# Patient Record
Sex: Male | Born: 1990
Health system: Southern US, Community
[De-identification: ages and names within clinical notes are randomized; demographics above are authoritative.]

## PROBLEM LIST (undated history)

## (undated) VITALS — BP 126/76 | HR 77 | Temp 97.8°F | Resp 16

## (undated) DIAGNOSIS — F39 Unspecified mood [affective] disorder: Secondary | ICD-10-CM

## (undated) DIAGNOSIS — F1911 Other psychoactive substance abuse, in remission: Secondary | ICD-10-CM

## (undated) DIAGNOSIS — R338 Other retention of urine: Secondary | ICD-10-CM

## (undated) DIAGNOSIS — F1010 Alcohol abuse, uncomplicated: Secondary | ICD-10-CM

## (undated) DIAGNOSIS — K645 Perianal venous thrombosis: Secondary | ICD-10-CM

## (undated) DIAGNOSIS — F329 Major depressive disorder, single episode, unspecified: Secondary | ICD-10-CM

## (undated) DIAGNOSIS — J302 Other seasonal allergic rhinitis: Secondary | ICD-10-CM

## (undated) DIAGNOSIS — R270 Ataxia, unspecified: Secondary | ICD-10-CM

## (undated) DIAGNOSIS — F32A Depression, unspecified: Secondary | ICD-10-CM

## (undated) DIAGNOSIS — K589 Irritable bowel syndrome without diarrhea: Secondary | ICD-10-CM

## (undated) DIAGNOSIS — F410 Panic disorder [episodic paroxysmal anxiety] without agoraphobia: Secondary | ICD-10-CM

## (undated) DIAGNOSIS — F1910 Other psychoactive substance abuse, uncomplicated: Secondary | ICD-10-CM

## (undated) HISTORY — DX: Unspecified mood (affective) disorder: F39

## (undated) HISTORY — DX: Other seasonal allergic rhinitis: J30.2

## (undated) HISTORY — PX: WISDOM TOOTH EXTRACTION: SHX21

## (undated) HISTORY — DX: Other psychoactive substance abuse, in remission: F19.11

## (undated) HISTORY — DX: Major depressive disorder, single episode, unspecified: F32.9

## (undated) HISTORY — DX: Other retention of urine: R33.8

## (undated) HISTORY — DX: Ataxia, unspecified: R27.0

## (undated) HISTORY — DX: Depression, unspecified: F32.A

## (undated) HISTORY — DX: Other psychoactive substance abuse, uncomplicated: F19.10

## (undated) HISTORY — DX: Alcohol abuse, uncomplicated: F10.10

## (undated) HISTORY — DX: Perianal venous thrombosis: K64.5

---
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
## 2011-01-24 ENCOUNTER — Emergency Department (HOSPITAL_COMMUNITY)
Admission: EM | Admit: 2011-01-24 | Discharge: 2011-01-24 | Disposition: A | Discharge status: Home / Self Care | Payer: 59 | Attending: Emergency Medicine | Admitting: Emergency Medicine

## 2011-01-24 DIAGNOSIS — T40904A Poisoning by unspecified psychodysleptics [hallucinogens], undetermined, initial encounter: Secondary | ICD-10-CM | POA: Insufficient documentation

## 2011-01-24 DIAGNOSIS — T40901A Poisoning by unspecified psychodysleptics [hallucinogens], accidental (unintentional), initial encounter: Secondary | ICD-10-CM | POA: Insufficient documentation

## 2011-01-24 DIAGNOSIS — E860 Dehydration: Secondary | ICD-10-CM | POA: Insufficient documentation

## 2011-01-24 DIAGNOSIS — I499 Cardiac arrhythmia, unspecified: Secondary | ICD-10-CM | POA: Insufficient documentation

## 2011-01-24 DIAGNOSIS — R112 Nausea with vomiting, unspecified: Secondary | ICD-10-CM | POA: Insufficient documentation

## 2011-01-24 LAB — POCT I-STAT, CHEM 8
BUN: 15 mg/dL (ref 6–23)
Calcium, Ion: 1.13 mmol/L (ref 1.12–1.32)
Hemoglobin: 13.3 g/dL (ref 13.0–17.0)
Sodium: 140 mEq/L (ref 135–145)
TCO2: 25 mmol/L (ref 0–100)

## 2011-06-04 ENCOUNTER — Ambulatory Visit: Payer: Self-pay | Admitting: Internal Medicine

## 2011-06-13 ENCOUNTER — Other Ambulatory Visit (INDEPENDENT_AMBULATORY_CARE_PROVIDER_SITE_OTHER): Payer: 59

## 2011-06-13 ENCOUNTER — Ambulatory Visit (INDEPENDENT_AMBULATORY_CARE_PROVIDER_SITE_OTHER): Payer: 59 | Admitting: Internal Medicine

## 2011-06-13 ENCOUNTER — Encounter: Payer: Self-pay | Admitting: Internal Medicine

## 2011-06-13 ENCOUNTER — Telehealth: Payer: Self-pay | Admitting: Internal Medicine

## 2011-06-13 VITALS — BP 140/80 | HR 80 | Temp 97.7°F | Resp 16 | Ht 73.0 in | Wt 162.0 lb

## 2011-06-13 DIAGNOSIS — R270 Ataxia, unspecified: Secondary | ICD-10-CM

## 2011-06-13 DIAGNOSIS — F1010 Alcohol abuse, uncomplicated: Secondary | ICD-10-CM

## 2011-06-13 DIAGNOSIS — R279 Unspecified lack of coordination: Secondary | ICD-10-CM

## 2011-06-13 DIAGNOSIS — F39 Unspecified mood [affective] disorder: Secondary | ICD-10-CM | POA: Insufficient documentation

## 2011-06-13 DIAGNOSIS — F1910 Other psychoactive substance abuse, uncomplicated: Secondary | ICD-10-CM

## 2011-06-13 LAB — COMPREHENSIVE METABOLIC PANEL
AST: 22 U/L (ref 0–37)
Alkaline Phosphatase: 85 U/L (ref 39–117)
BUN: 14 mg/dL (ref 6–23)
Glucose, Bld: 74 mg/dL (ref 70–99)
Sodium: 141 mEq/L (ref 135–145)
Total Bilirubin: 0.9 mg/dL (ref 0.3–1.2)
Total Protein: 7.3 g/dL (ref 6.0–8.3)

## 2011-06-13 LAB — CBC WITH DIFFERENTIAL/PLATELET
Eosinophils Relative: 3.5 % (ref 0.0–5.0)
HCT: 45.4 % (ref 39.0–52.0)
Hemoglobin: 15.5 g/dL (ref 13.0–17.0)
Lymphocytes Relative: 34.3 % (ref 12.0–46.0)
Lymphs Abs: 1.8 10*3/uL (ref 0.7–4.0)
Monocytes Relative: 10.1 % (ref 3.0–12.0)
Platelets: 358 10*3/uL (ref 150.0–400.0)
WBC: 5.2 10*3/uL (ref 4.5–10.5)

## 2011-06-13 LAB — URINALYSIS
Nitrite: NEGATIVE
Total Protein, Urine: NEGATIVE
Urine Glucose: NEGATIVE
pH: 6 (ref 5.0–8.0)

## 2011-06-13 LAB — TSH: TSH: 1.62 u[IU]/mL (ref 0.35–5.50)

## 2011-06-13 LAB — VITAMIN B12: Vitamin B-12: 565 pg/mL (ref 211–911)

## 2011-06-13 MED ORDER — VITAMIN D 1000 UNITS PO TABS: 1000.0000 [IU] | ORAL_TABLET | Freq: Every day | ORAL | Status: DC

## 2011-06-13 NOTE — Telephone Encounter (Signed)
Pt's father informed.

## 2011-06-13 NOTE — Progress Notes (Signed)
  Subjective:    Patient ID: Jay Stevenson, male    DOB: June 15, 1991, 20 y.o.   MRN: [id number]  HPI  New pt with a h/o ADD  Behavior problems x years: drug and alcohol use, severe intoxication in the past; hospitalization this past spring w/delirium. No drug or alcohol use x 1 mo per pt. Now he is  in [employer] - taking basic courses   Review of Systems  Constitutional: Negative for appetite change, fatigue and unexpected weight change.  HENT: Negative for nosebleeds, congestion, sore throat, sneezing, trouble swallowing and neck pain.   Eyes: Negative for itching and visual disturbance.  Respiratory: Negative for cough.   Cardiovascular: Negative for chest pain, palpitations and leg swelling.  Gastrointestinal: Negative for nausea, diarrhea, blood in stool and abdominal distention.  Genitourinary: Negative for frequency and hematuria.  Musculoskeletal: Negative for back pain, joint swelling and gait problem.  Skin: Negative for rash.  Neurological: Negative for dizziness, tremors, speech difficulty and weakness.  Psychiatric/Behavioral: Negative for suicidal ideas, hallucinations, confusion, sleep disturbance, dysphoric mood and agitation. Behavioral problem: anger issues - less now; not homicidal. The patient is not nervous/anxious and is not hyperactive.        Objective:   Physical Exam  Constitutional: He is oriented to person, place, and time. He appears well-developed.  HENT:  Mouth/Throat: Oropharynx is clear and moist.  Eyes: Conjunctivae are normal. Pupils are equal, round, and reactive to light.  Neck: Normal range of motion. No JVD present. No thyromegaly present.  Cardiovascular: Normal rate, regular rhythm, normal heart sounds and intact distal pulses.  Exam reveals no gallop and no friction rub.   No murmur heard. Pulmonary/Chest: Effort normal and breath sounds normal. No respiratory distress. He has no wheezes. He has no rales. He exhibits no  tenderness.  Abdominal: Soft. Bowel sounds are normal. He exhibits no distension and no mass. There is no tenderness. There is no rebound and no guarding.  Musculoskeletal: Normal range of motion. He exhibits no edema and no tenderness.  Lymphadenopathy:    He has no cervical adenopathy.  Neurological: He is alert and oriented to person, place, and time. He has normal reflexes. No cranial nerve deficit. He exhibits normal muscle tone. Coordination (ataxic: strait line walking is unsteady) abnormal.  Skin: Skin is warm and dry. No rash noted.  Psychiatric: He has a normal mood and affect. His behavior is normal. Thought content normal.       A/o/c          Assessment & Plan:

## 2011-06-13 NOTE — Telephone Encounter (Signed)
Stacey, please, inform patient that all labs are normal Thx 

## 2011-06-16 ENCOUNTER — Encounter: Payer: Self-pay | Admitting: Internal Medicine

## 2011-06-16 DIAGNOSIS — F1011 Alcohol abuse, in remission: Secondary | ICD-10-CM | POA: Insufficient documentation

## 2011-06-16 DIAGNOSIS — F1911 Other psychoactive substance abuse, in remission: Secondary | ICD-10-CM | POA: Insufficient documentation

## 2011-06-16 DIAGNOSIS — R270 Ataxia, unspecified: Secondary | ICD-10-CM

## 2011-06-16 HISTORY — DX: Ataxia, unspecified: R27.0

## 2011-06-16 NOTE — Assessment & Plan Note (Signed)
Better now per pt

## 2011-06-16 NOTE — Assessment & Plan Note (Signed)
Induced by prior drug use, I guess... Discussed Get labs He had a brain scan 1-2 years back

## 2011-06-16 NOTE — Assessment & Plan Note (Signed)
Teenage years. Discussed. He quit.  Potential risks  and complications of drugs and alcohol use including death were explained to the patient and were aknowledged. He will see Dr Dellia Cloud

## 2011-06-16 NOTE — Assessment & Plan Note (Signed)
Teenage years Discussed. No alcohol x 30 d

## 2011-06-24 ENCOUNTER — Ambulatory Visit (INDEPENDENT_AMBULATORY_CARE_PROVIDER_SITE_OTHER): Payer: 59 | Admitting: Licensed Clinical Social Worker

## 2011-06-24 DIAGNOSIS — F331 Major depressive disorder, recurrent, moderate: Secondary | ICD-10-CM

## 2011-06-24 DIAGNOSIS — F411 Generalized anxiety disorder: Secondary | ICD-10-CM

## 2011-07-01 ENCOUNTER — Ambulatory Visit (INDEPENDENT_AMBULATORY_CARE_PROVIDER_SITE_OTHER): Payer: 59 | Admitting: Licensed Clinical Social Worker

## 2011-07-01 DIAGNOSIS — F331 Major depressive disorder, recurrent, moderate: Secondary | ICD-10-CM

## 2011-07-01 DIAGNOSIS — F411 Generalized anxiety disorder: Secondary | ICD-10-CM

## 2011-07-08 ENCOUNTER — Ambulatory Visit (INDEPENDENT_AMBULATORY_CARE_PROVIDER_SITE_OTHER): Payer: 59 | Admitting: Licensed Clinical Social Worker

## 2011-07-08 DIAGNOSIS — F411 Generalized anxiety disorder: Secondary | ICD-10-CM

## 2011-07-08 DIAGNOSIS — F331 Major depressive disorder, recurrent, moderate: Secondary | ICD-10-CM

## 2011-07-22 ENCOUNTER — Ambulatory Visit (INDEPENDENT_AMBULATORY_CARE_PROVIDER_SITE_OTHER): Payer: 59 | Admitting: Licensed Clinical Social Worker

## 2011-07-22 DIAGNOSIS — F331 Major depressive disorder, recurrent, moderate: Secondary | ICD-10-CM

## 2011-07-22 DIAGNOSIS — F411 Generalized anxiety disorder: Secondary | ICD-10-CM

## 2011-07-29 ENCOUNTER — Ambulatory Visit: Payer: 59 | Admitting: Licensed Clinical Social Worker

## 2011-07-31 ENCOUNTER — Ambulatory Visit (INDEPENDENT_AMBULATORY_CARE_PROVIDER_SITE_OTHER): Payer: 59 | Admitting: Licensed Clinical Social Worker

## 2011-07-31 DIAGNOSIS — F331 Major depressive disorder, recurrent, moderate: Secondary | ICD-10-CM

## 2011-07-31 DIAGNOSIS — F411 Generalized anxiety disorder: Secondary | ICD-10-CM

## 2011-08-05 ENCOUNTER — Ambulatory Visit (INDEPENDENT_AMBULATORY_CARE_PROVIDER_SITE_OTHER): Payer: 59 | Admitting: Licensed Clinical Social Worker

## 2011-08-05 DIAGNOSIS — F411 Generalized anxiety disorder: Secondary | ICD-10-CM

## 2011-08-05 DIAGNOSIS — F331 Major depressive disorder, recurrent, moderate: Secondary | ICD-10-CM

## 2011-08-12 ENCOUNTER — Ambulatory Visit (INDEPENDENT_AMBULATORY_CARE_PROVIDER_SITE_OTHER): Payer: 59 | Admitting: Licensed Clinical Social Worker

## 2011-08-12 DIAGNOSIS — F411 Generalized anxiety disorder: Secondary | ICD-10-CM

## 2011-08-12 DIAGNOSIS — F331 Major depressive disorder, recurrent, moderate: Secondary | ICD-10-CM

## 2011-08-19 ENCOUNTER — Ambulatory Visit: Payer: Self-pay | Admitting: Licensed Clinical Social Worker

## 2011-08-20 ENCOUNTER — Encounter: Payer: Self-pay | Admitting: Internal Medicine

## 2011-08-20 ENCOUNTER — Ambulatory Visit (INDEPENDENT_AMBULATORY_CARE_PROVIDER_SITE_OTHER): Payer: 59 | Admitting: Internal Medicine

## 2011-08-20 VITALS — BP 132/90 | HR 72 | Temp 97.9°F | Resp 16 | Wt 171.0 lb

## 2011-08-20 DIAGNOSIS — R279 Unspecified lack of coordination: Secondary | ICD-10-CM

## 2011-08-20 DIAGNOSIS — F39 Unspecified mood [affective] disorder: Secondary | ICD-10-CM

## 2011-08-20 DIAGNOSIS — Z23 Encounter for immunization: Secondary | ICD-10-CM

## 2011-08-20 DIAGNOSIS — F1910 Other psychoactive substance abuse, uncomplicated: Secondary | ICD-10-CM

## 2011-08-20 DIAGNOSIS — R270 Ataxia, unspecified: Secondary | ICD-10-CM

## 2011-08-21 NOTE — Assessment & Plan Note (Signed)
No relapse 

## 2011-08-21 NOTE — Progress Notes (Signed)
  Subjective:    Patient ID: Jay Stevenson, male    DOB: 11/16/1990, 20 y.o.   MRN: [id number]  HPI    The patient is here to follow up on chronic mood disorder, depression, anxiety symptoms controlled with his determination to get better and exercise. He states and his dad is confirming that Jay Stevenson is doing much better   Review of Systems  Constitutional: Negative for appetite change, fatigue and unexpected weight change.  HENT: Negative for nosebleeds, congestion, sore throat, sneezing, trouble swallowing and neck pain.   Eyes: Negative for itching and visual disturbance.  Respiratory: Negative for cough.   Cardiovascular: Negative for chest pain, palpitations and leg swelling.  Gastrointestinal: Negative for nausea, diarrhea, blood in stool and abdominal distention.  Genitourinary: Negative for frequency and hematuria.  Musculoskeletal: Negative for back pain, joint swelling and gait problem.  Skin: Negative for rash.  Neurological: Negative for dizziness, tremors, speech difficulty and weakness.  Psychiatric/Behavioral: Positive for behavioral problems (better, much less angry) and decreased concentration (better). Negative for suicidal ideas, hallucinations, confusion, sleep disturbance, self-injury, dysphoric mood and agitation. The patient is not nervous/anxious and is not hyperactive.        Objective:   Physical Exam  Constitutional: He appears well-developed and well-nourished. No distress.  Cardiovascular: Normal rate.  Exam reveals no gallop.   Pulmonary/Chest: He has no wheezes. He has no rales.  Abdominal: There is no tenderness.  Psychiatric: He has a normal mood and affect. His behavior is normal. Judgment and thought content normal.          Assessment & Plan:

## 2011-08-21 NOTE — Assessment & Plan Note (Addendum)
Better. Discussed. He has been seeing S Bond weeekly

## 2011-08-21 NOTE — Assessment & Plan Note (Signed)
Better  

## 2011-08-26 ENCOUNTER — Telehealth: Payer: Self-pay | Admitting: *Deleted

## 2011-08-26 ENCOUNTER — Ambulatory Visit (INDEPENDENT_AMBULATORY_CARE_PROVIDER_SITE_OTHER): Payer: 59 | Admitting: Licensed Clinical Social Worker

## 2011-08-26 DIAGNOSIS — F331 Major depressive disorder, recurrent, moderate: Secondary | ICD-10-CM

## 2011-08-26 DIAGNOSIS — F411 Generalized anxiety disorder: Secondary | ICD-10-CM

## 2011-08-26 MED ORDER — BUPROPION HCL ER (XL) 150 MG PO TB24: 150.0000 mg | ORAL_TABLET | ORAL | Status: DC

## 2011-08-26 NOTE — Telephone Encounter (Signed)
Wellbutrin discussed at 11.21.12 OV; patient would like to get Rx.

## 2011-08-26 NOTE — Telephone Encounter (Signed)
Ok Wellbutr xl 1 po qam. ROV 6-8 wks. Let me or S Bond know immidiately if any suicidal thoughts appear Thx

## 2011-08-27 NOTE — Telephone Encounter (Signed)
Pt advised of Rx/pharmacy via VM and asked to call back (or dr Lottie Dawson) with any side effects.

## 2011-09-02 ENCOUNTER — Ambulatory Visit (INDEPENDENT_AMBULATORY_CARE_PROVIDER_SITE_OTHER): Payer: 59 | Admitting: Licensed Clinical Social Worker

## 2011-09-02 DIAGNOSIS — F331 Major depressive disorder, recurrent, moderate: Secondary | ICD-10-CM

## 2011-09-02 DIAGNOSIS — F411 Generalized anxiety disorder: Secondary | ICD-10-CM

## 2011-09-09 ENCOUNTER — Ambulatory Visit (INDEPENDENT_AMBULATORY_CARE_PROVIDER_SITE_OTHER): Payer: 59 | Admitting: Licensed Clinical Social Worker

## 2011-09-09 DIAGNOSIS — F331 Major depressive disorder, recurrent, moderate: Secondary | ICD-10-CM

## 2011-09-09 DIAGNOSIS — F411 Generalized anxiety disorder: Secondary | ICD-10-CM

## 2011-09-16 ENCOUNTER — Ambulatory Visit (INDEPENDENT_AMBULATORY_CARE_PROVIDER_SITE_OTHER): Payer: 59 | Admitting: Licensed Clinical Social Worker

## 2011-09-16 DIAGNOSIS — F411 Generalized anxiety disorder: Secondary | ICD-10-CM

## 2011-09-16 DIAGNOSIS — F331 Major depressive disorder, recurrent, moderate: Secondary | ICD-10-CM

## 2011-10-06 ENCOUNTER — Telehealth: Payer: Self-pay | Admitting: *Deleted

## 2011-10-06 NOTE — Telephone Encounter (Signed)
Pt's father left vm stating pt is having urinary symptoms. He stated they were in [place] and wanted advisement. I called the father as soon as I checked message and he states he went ahead and took pt to an UC near by and he was dx and treated for UTI.

## 2011-10-07 ENCOUNTER — Ambulatory Visit (INDEPENDENT_AMBULATORY_CARE_PROVIDER_SITE_OTHER): Payer: 59 | Admitting: Licensed Clinical Social Worker

## 2011-10-07 DIAGNOSIS — F411 Generalized anxiety disorder: Secondary | ICD-10-CM

## 2011-10-07 DIAGNOSIS — F331 Major depressive disorder, recurrent, moderate: Secondary | ICD-10-CM

## 2011-10-15 ENCOUNTER — Ambulatory Visit (INDEPENDENT_AMBULATORY_CARE_PROVIDER_SITE_OTHER): Payer: 59 | Admitting: Licensed Clinical Social Worker

## 2011-10-15 DIAGNOSIS — F331 Major depressive disorder, recurrent, moderate: Secondary | ICD-10-CM

## 2011-10-15 DIAGNOSIS — F411 Generalized anxiety disorder: Secondary | ICD-10-CM

## 2011-10-22 ENCOUNTER — Ambulatory Visit (INDEPENDENT_AMBULATORY_CARE_PROVIDER_SITE_OTHER): Payer: 59 | Admitting: Licensed Clinical Social Worker

## 2011-10-22 DIAGNOSIS — F331 Major depressive disorder, recurrent, moderate: Secondary | ICD-10-CM

## 2011-10-22 DIAGNOSIS — F411 Generalized anxiety disorder: Secondary | ICD-10-CM

## 2011-10-29 ENCOUNTER — Ambulatory Visit (INDEPENDENT_AMBULATORY_CARE_PROVIDER_SITE_OTHER): Payer: 59 | Admitting: Licensed Clinical Social Worker

## 2011-10-29 DIAGNOSIS — F411 Generalized anxiety disorder: Secondary | ICD-10-CM

## 2011-10-29 DIAGNOSIS — F331 Major depressive disorder, recurrent, moderate: Secondary | ICD-10-CM

## 2011-11-05 ENCOUNTER — Ambulatory Visit (INDEPENDENT_AMBULATORY_CARE_PROVIDER_SITE_OTHER): Payer: 59 | Admitting: Licensed Clinical Social Worker

## 2011-11-05 DIAGNOSIS — F411 Generalized anxiety disorder: Secondary | ICD-10-CM

## 2011-11-05 DIAGNOSIS — F331 Major depressive disorder, recurrent, moderate: Secondary | ICD-10-CM

## 2011-11-18 ENCOUNTER — Ambulatory Visit: Payer: 59 | Admitting: Licensed Clinical Social Worker

## 2011-11-26 ENCOUNTER — Ambulatory Visit (INDEPENDENT_AMBULATORY_CARE_PROVIDER_SITE_OTHER): Payer: 59 | Admitting: Licensed Clinical Social Worker

## 2011-11-26 DIAGNOSIS — F411 Generalized anxiety disorder: Secondary | ICD-10-CM

## 2011-11-26 DIAGNOSIS — F331 Major depressive disorder, recurrent, moderate: Secondary | ICD-10-CM

## 2011-12-03 ENCOUNTER — Ambulatory Visit (INDEPENDENT_AMBULATORY_CARE_PROVIDER_SITE_OTHER): Payer: 59 | Admitting: Licensed Clinical Social Worker

## 2011-12-03 DIAGNOSIS — F331 Major depressive disorder, recurrent, moderate: Secondary | ICD-10-CM

## 2011-12-03 DIAGNOSIS — F411 Generalized anxiety disorder: Secondary | ICD-10-CM

## 2011-12-12 ENCOUNTER — Telehealth: Payer: Self-pay | Admitting: *Deleted

## 2011-12-12 NOTE — Telephone Encounter (Signed)
Pt's father calling req referral to [facility] for neuroimaging that was recommended by his psychiatrist to check for possible head injury. Please advise.  [facility] number is [phone].

## 2011-12-14 NOTE — Telephone Encounter (Signed)
I'm not sure what this is - best to come for an OV Thx

## 2011-12-15 NOTE — Telephone Encounter (Signed)
Left message for pts father to call back. 

## 2011-12-15 NOTE — Telephone Encounter (Signed)
Pt's father informed. OV scheduled.

## 2011-12-16 ENCOUNTER — Ambulatory Visit: Payer: 59 | Admitting: Licensed Clinical Social Worker

## 2011-12-17 ENCOUNTER — Encounter: Payer: Self-pay | Admitting: Internal Medicine

## 2011-12-17 ENCOUNTER — Ambulatory Visit (INDEPENDENT_AMBULATORY_CARE_PROVIDER_SITE_OTHER): Payer: 59 | Admitting: Licensed Clinical Social Worker

## 2011-12-17 ENCOUNTER — Ambulatory Visit (INDEPENDENT_AMBULATORY_CARE_PROVIDER_SITE_OTHER): Payer: 59 | Admitting: Internal Medicine

## 2011-12-17 VITALS — BP 140/90 | HR 88 | Temp 96.5°F | Resp 16 | Wt 176.0 lb

## 2011-12-17 DIAGNOSIS — R270 Ataxia, unspecified: Secondary | ICD-10-CM

## 2011-12-17 DIAGNOSIS — F1010 Alcohol abuse, uncomplicated: Secondary | ICD-10-CM

## 2011-12-17 DIAGNOSIS — R279 Unspecified lack of coordination: Secondary | ICD-10-CM

## 2011-12-17 DIAGNOSIS — F411 Generalized anxiety disorder: Secondary | ICD-10-CM

## 2011-12-17 DIAGNOSIS — F1910 Other psychoactive substance abuse, uncomplicated: Secondary | ICD-10-CM

## 2011-12-17 DIAGNOSIS — F39 Unspecified mood [affective] disorder: Secondary | ICD-10-CM

## 2011-12-17 DIAGNOSIS — F331 Major depressive disorder, recurrent, moderate: Secondary | ICD-10-CM

## 2011-12-17 MED ORDER — L-METHYLFOLATE 15 MG PO TABS: ORAL_TABLET | ORAL | Status: DC

## 2011-12-17 NOTE — Assessment & Plan Note (Signed)
Denies using now

## 2011-12-17 NOTE — Assessment & Plan Note (Signed)
Better  

## 2011-12-17 NOTE — Assessment & Plan Note (Signed)
Not drinking 

## 2011-12-17 NOTE — Assessment & Plan Note (Signed)
Psych consult Samples of Deplin 15 mg a day

## 2011-12-17 NOTE — Progress Notes (Signed)
Patient ID: Clovis Mankins, male   DOB: 02/13/1991, 21 y.o.   MRN: [id number]  Subjective:    Patient ID: Sallie Maker, male    DOB: Mar 07, 1991, 21 y.o.   MRN: [id number]  HPI    The patient is here to follow up on chronic mood disorder, depression, anxiety symptoms controlled with his determination to get better and exercise. He states and his dad is confirming that Senay is doing much better  BP Readings from Last 3 Encounters:  12/17/11 140/90  08/20/11 132/90  06/13/11 140/80   Wt Readings from Last 3 Encounters:  12/17/11 176 lb (79.833 kg) (77.88%*)  08/20/11 171 lb (77.565 kg) (74.14%*)  06/13/11 162 lb (73.483 kg) (64.14%*)   * Growth percentiles are based on CDC 2-20 Years data.    Review of Systems  Constitutional: Negative for appetite change, fatigue and unexpected weight change.  HENT: Negative for nosebleeds, congestion, sore throat, sneezing, trouble swallowing and neck pain.   Eyes: Negative for itching and visual disturbance.  Respiratory: Negative for cough.   Cardiovascular: Negative for chest pain, palpitations and leg swelling.  Gastrointestinal: Negative for nausea, diarrhea, blood in stool and abdominal distention.  Genitourinary: Negative for frequency and hematuria.  Musculoskeletal: Negative for back pain, joint swelling and gait problem.  Skin: Negative for rash.  Neurological: Negative for dizziness, tremors, speech difficulty and weakness.  Psychiatric/Behavioral: Positive for behavioral problems (better, much less angry) and decreased concentration (better). Negative for suicidal ideas, hallucinations, confusion, sleep disturbance, self-injury, dysphoric mood and agitation. The patient is not nervous/anxious and is not hyperactive.        Objective:   Physical Exam  Constitutional: He appears well-developed and well-nourished. No distress.  Cardiovascular: Normal rate.  Exam reveals no gallop.   Pulmonary/Chest: He has no wheezes. He has no  rales.  Abdominal: There is no tenderness.  Psychiatric: He has a normal mood and affect. His behavior is normal. Judgment and thought content normal.          Assessment & Plan:

## 2011-12-24 ENCOUNTER — Ambulatory Visit (INDEPENDENT_AMBULATORY_CARE_PROVIDER_SITE_OTHER): Payer: 59 | Admitting: Licensed Clinical Social Worker

## 2011-12-24 DIAGNOSIS — F331 Major depressive disorder, recurrent, moderate: Secondary | ICD-10-CM

## 2011-12-24 DIAGNOSIS — F411 Generalized anxiety disorder: Secondary | ICD-10-CM

## 2012-01-01 ENCOUNTER — Ambulatory Visit (INDEPENDENT_AMBULATORY_CARE_PROVIDER_SITE_OTHER): Payer: 59 | Admitting: Licensed Clinical Social Worker

## 2012-01-01 DIAGNOSIS — F411 Generalized anxiety disorder: Secondary | ICD-10-CM

## 2012-01-01 DIAGNOSIS — F331 Major depressive disorder, recurrent, moderate: Secondary | ICD-10-CM

## 2012-01-14 ENCOUNTER — Ambulatory Visit (INDEPENDENT_AMBULATORY_CARE_PROVIDER_SITE_OTHER): Payer: 59 | Admitting: Licensed Clinical Social Worker

## 2012-01-14 DIAGNOSIS — F411 Generalized anxiety disorder: Secondary | ICD-10-CM

## 2012-01-14 DIAGNOSIS — F331 Major depressive disorder, recurrent, moderate: Secondary | ICD-10-CM

## 2012-01-28 ENCOUNTER — Ambulatory Visit (INDEPENDENT_AMBULATORY_CARE_PROVIDER_SITE_OTHER): Payer: 59 | Admitting: Licensed Clinical Social Worker

## 2012-01-28 DIAGNOSIS — F331 Major depressive disorder, recurrent, moderate: Secondary | ICD-10-CM

## 2012-01-28 DIAGNOSIS — F411 Generalized anxiety disorder: Secondary | ICD-10-CM

## 2012-03-21 ENCOUNTER — Emergency Department (HOSPITAL_COMMUNITY)
Admission: EM | Admit: 2012-03-21 | Discharge: 2012-03-22 | Disposition: A | Discharge status: Home / Self Care | Payer: 59 | Attending: Emergency Medicine | Admitting: Emergency Medicine

## 2012-03-21 ENCOUNTER — Encounter (HOSPITAL_COMMUNITY): Payer: Self-pay | Admitting: Emergency Medicine

## 2012-03-21 DIAGNOSIS — K644 Residual hemorrhoidal skin tags: Secondary | ICD-10-CM | POA: Insufficient documentation

## 2012-03-21 DIAGNOSIS — K649 Unspecified hemorrhoids: Secondary | ICD-10-CM

## 2012-03-21 DIAGNOSIS — Z87891 Personal history of nicotine dependence: Secondary | ICD-10-CM | POA: Insufficient documentation

## 2012-03-21 MED ORDER — LIDOCAINE HCL 1 % IJ SOLN
INTRAMUSCULAR | Status: AC
  Filled 2012-03-21: qty 20

## 2012-03-21 MED ORDER — LIDOCAINE HCL 2 % IJ SOLN
5.0000 mL | Freq: Once | INTRAMUSCULAR | Status: DC
  Filled 2012-03-21: qty 1

## 2012-03-21 NOTE — ED Notes (Signed)
Pt alert, nad, hx hemorrhoids, c/o rectal pain, last BM was yesterday

## 2012-03-21 NOTE — ED Notes (Signed)
Pt states that he is having new onset rectal pain that began this morning. Pt states that he thought it was hemorrhoids. Pt states that he has not had any bleeding. Pt denies any trauma or penetration.

## 2012-03-21 NOTE — ED Provider Notes (Signed)
History     CSN: [id number]  Arrival date & time 03/21/12  2148   First MD Initiated Contact with Patient 03/21/12 2231      Chief Complaint  Patient presents with  . Rectal Pain    HPI  History provided by the patient. Patient is a 21 year old male with no significant past medical history who presents with complaints of "hemorrhoids" and rectal pain. Patient states that symptoms began earlier this morning after sitting for prolonged time on the toilet. Patient denies having any diarrhea or constipation symptoms. He does report straining slightly with bowel movement. Following that time he has had increasing pain to the rectal area. Patient states she looked in the mirror and saw a swollen area is that he thought were hemorrhoids. Patient denies having similar symptoms previously. Patient went to the store and has been trying to use over-the-counter hemorrhoid creams and ointments without improvement. Patient is also soak in warm tub but complains of increasing pain. Patient states he is uncomfortable in any position. Patient has not attempted any other treatments. He denies any other aggravating or alleviating factors. Patient denies any other associated symptoms. Denies any bleeding or drainage.    Past Medical History  Diagnosis Date  . Mood disorder     anger management  . Polysubstance abuse   . Alcohol abuse   . H/O: substance abuse     History reviewed. No pertinent past surgical history.  Family History  Problem Relation Age of Onset  . Depression Mother     History  Substance Use Topics  . 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 Smokeless tobacco: Not on file  . Alcohol Use: No     quit in Aug 2012      Review of Systems  Constitutional: Negative for fever.  Gastrointestinal: Positive for rectal pain. Negative for nausea, vomiting, abdominal pain, diarrhea, constipation and anal bleeding.    Allergies  Review of patient's allergies indicates no known  allergies.  Home Medications  No current outpatient prescriptions on file.  BP 132/75  Pulse 100  Temp 97.9 F (36.6 C) (Oral)  Resp 18  SpO2 99%  Physical Exam  Nursing note and vitals reviewed. Constitutional: He is oriented to person, place, and time. He appears well-developed and well-nourished. No distress.  HENT:  Head: Normocephalic.  Cardiovascular: Normal rate and regular rhythm.   Pulmonary/Chest: Effort normal and breath sounds normal.  Abdominal: Soft. There is no tenderness.  Genitourinary: Rectal exam shows external hemorrhoid and tenderness.       Several swollen and tender hemorrhoids.  No bleeding.  Neurological: He is alert and oriented to person, place, and time.  Skin: Skin is warm.  Psychiatric: He has a normal mood and affect.    ED Course  Procedures   INCISION AND DRAINAGE Performed by: Angus Seller Consent: Verbal consent obtained. Risks and benefits: risks, benefits and alternatives were discussed Type: hemorrhoid  Body area: anus  Anesthesia: local infiltration  Local anesthetic: lidocaine 2% without epinephrine  Anesthetic total: 1 ml  Complexity: complex Blunt dissection to remove clots   Patient tolerance: Patient tolerated the procedure well with no immediate complications, bleeding controlled, pain improved.      1. Hemorrhoids       MDM  Patient seen and evaluated. Patient in no acute distress        Angus Seller, [place] 03/22/12 [phone]

## 2012-03-22 ENCOUNTER — Encounter (HOSPITAL_COMMUNITY): Payer: Self-pay | Admitting: Emergency Medicine

## 2012-03-22 ENCOUNTER — Telehealth: Payer: Self-pay | Admitting: Internal Medicine

## 2012-03-22 ENCOUNTER — Emergency Department (HOSPITAL_COMMUNITY)
Admission: EM | Admit: 2012-03-22 | Discharge: 2012-03-23 | Disposition: A | Discharge status: Home / Self Care | Payer: 59 | Attending: Emergency Medicine | Admitting: Emergency Medicine

## 2012-03-22 ENCOUNTER — Telehealth (INDEPENDENT_AMBULATORY_CARE_PROVIDER_SITE_OTHER): Payer: Self-pay | Admitting: General Surgery

## 2012-03-22 DIAGNOSIS — Z818 Family history of other mental and behavioral disorders: Secondary | ICD-10-CM | POA: Insufficient documentation

## 2012-03-22 DIAGNOSIS — Z809 Family history of malignant neoplasm, unspecified: Secondary | ICD-10-CM | POA: Insufficient documentation

## 2012-03-22 DIAGNOSIS — Z8249 Family history of ischemic heart disease and other diseases of the circulatory system: Secondary | ICD-10-CM | POA: Insufficient documentation

## 2012-03-22 DIAGNOSIS — R339 Retention of urine, unspecified: Secondary | ICD-10-CM | POA: Insufficient documentation

## 2012-03-22 DIAGNOSIS — F1010 Alcohol abuse, uncomplicated: Secondary | ICD-10-CM | POA: Insufficient documentation

## 2012-03-22 DIAGNOSIS — F411 Generalized anxiety disorder: Secondary | ICD-10-CM | POA: Insufficient documentation

## 2012-03-22 DIAGNOSIS — F1910 Other psychoactive substance abuse, uncomplicated: Secondary | ICD-10-CM | POA: Insufficient documentation

## 2012-03-22 DIAGNOSIS — Z87891 Personal history of nicotine dependence: Secondary | ICD-10-CM | POA: Insufficient documentation

## 2012-03-22 DIAGNOSIS — Z823 Family history of stroke: Secondary | ICD-10-CM | POA: Insufficient documentation

## 2012-03-22 DIAGNOSIS — F39 Unspecified mood [affective] disorder: Secondary | ICD-10-CM | POA: Insufficient documentation

## 2012-03-22 HISTORY — DX: Panic disorder (episodic paroxysmal anxiety): F41.0

## 2012-03-22 MED ORDER — POLYETHYLENE GLYCOL 3350 17 GM/SCOOP PO POWD: 17.0000 g | Freq: Every day | ORAL | Status: AC

## 2012-03-22 MED ORDER — OXYCODONE-ACETAMINOPHEN 5-325 MG PO TABS
2.0000 | ORAL_TABLET | Freq: Once | ORAL | Status: AC
  Administered 2012-03-22: 2 via ORAL
  Filled 2012-03-22: qty 2

## 2012-03-22 MED ORDER — OXYCODONE-ACETAMINOPHEN 10-325 MG PO TABS: 1.0000 | ORAL_TABLET | ORAL | Status: AC | PRN

## 2012-03-22 MED ORDER — SILODOSIN 4 MG PO CAPS: 4.0000 mg | ORAL_CAPSULE | Freq: Every day | ORAL | Status: DC

## 2012-03-22 NOTE — Telephone Encounter (Signed)
Informed pt's mother to start Rapaflo 4 mg and hold oxycodone. Take Ibuprofen 800 mg po bid prn and let us know if symptoms persist.

## 2012-03-22 NOTE — ED Notes (Signed)
Pt. Complaint is pain is getting worse and is becoming unbearable

## 2012-03-22 NOTE — Telephone Encounter (Signed)
Pt's mother Darl Pikes [phone]) is stating that pt has not been able to urinate since having procedure done at ER last night. Pt denies urinary pain but thinks that urinary difficulty is related to extreme anxiety related to "excruciating pain" from hemorrhoids and surgery. Please advise

## 2012-03-22 NOTE — Telephone Encounter (Signed)
Caller: Susan/Mother; PCP: Sonda Primes; CB#: [phone]; ; ; Call regarding Urinary Pain/Bleeding;  Mom is calling to say the pt had to have several painful bleeding hemhorroids removed at the ED/ [facility] at midnight last night. Pt has anxiety/severe. Mom has taken him to a psychiatrist and is treating him with Xanax 0.5mg  - 1mg  po BID but under the circumstances he is very anxious. Today the pt has urinated x 1 this am at 06:30 but has been complaining that it is difficult. Pt has fallen asleep now but is insistent that if he has trouble urinating he wants to go to the hospital and be catheterized. Mom is calling the office to ask if catheterization at the office is a possibility if when he wakes up and it is challenging. OFFICE PLEASE CALL MOM BACK AND LET HER KNOW.

## 2012-03-22 NOTE — Telephone Encounter (Signed)
Mother of pt calling with question about hems:  Son in ER last night where his thrombosed hem was opened and clots evacuated.  This morning he is still actively bleeding.  Mother not quite sure why the discharge orders stated to call here.  I explained that this office is for surgeons and her son may require surgery for the hem at some point.  Advise mom to consider returning to ER is son is still actively bleeding.  She understands.

## 2012-03-22 NOTE — Discharge Instructions (Signed)
You were seen and treated for your hemorrhoids today. These were opened with a small incisions to remove clots and relieve your pain symptoms. Please continue to use warm soaks 4-5 times regularly to help with healing in your symptoms. Use a stool softener daily to help keep your stools soft and avoid from straining. Do not sit for prolonged time. If you have any persistent bleeding, increasing pain or swelling please return to emergency room or followup with a general surgeon for continued evaluation and treatment.    Hemorrhoids Hemorrhoids are enlarged (dilated) veins around the rectum. There are 2 types of hemorrhoids, and the type of hemorrhoid is determined by its location. Internal hemorrhoids occur in the veins just inside the rectum.They are usually not painful, but they may bleed.However, they may poke through to the outside and become irritated and painful. External hemorrhoids involve the veins outside the anus and can be felt as a painful swelling or hard lump near the anus.They are often itchy and may crack and bleed. Sometimes clots will form in the veins. This makes them swollen and painful. These are called thrombosed hemorrhoids. CAUSES Causes of hemorrhoids include:  Pregnancy. This increases the pressure in the hemorrhoidal veins.   Constipation.   Straining to have a bowel movement.   Obesity.   Heavy lifting or other activity that caused you to strain.  TREATMENT Most of the time hemorrhoids improve in 1 to 2 weeks. However, if symptoms do not seem to be getting better or if you have a lot of rectal bleeding, your caregiver may perform a procedure to help make the hemorrhoids get smaller or remove them completely.Possible treatments include:  Rubber band ligation. A rubber band is placed at the base of the hemorrhoid to cut off the circulation.   Sclerotherapy. A chemical is injected to shrink the hemorrhoid.   Infrared light therapy. Tools are used to burn the  hemorrhoid.   Hemorrhoidectomy. This is surgical removal of the hemorrhoid.  HOME CARE INSTRUCTIONS   Increase fiber in your diet. Ask your caregiver about using fiber supplements.   Drink enough water and fluids to keep your urine clear or pale yellow.   Exercise regularly.   Go to the bathroom when you have the urge to have a bowel movement. Do not wait.   Avoid straining to have bowel movements.   Keep the anal area dry and clean.   Only take over-the-counter or prescription medicines for pain, discomfort, or fever as directed by your caregiver.  If your hemorrhoids are thrombosed:  Take warm sitz baths for 20 to 30 minutes, 3 to 4 times per day.   If the hemorrhoids are very tender and swollen, place ice packs on the area as tolerated. Using ice packs between sitz baths may be helpful. Fill a plastic bag with ice. Place a towel between the bag of ice and your skin.   Medicated creams and suppositories may be used or applied as directed.   Do not use a donut-shaped pillow or sit on the toilet for long periods. This increases blood pooling and pain.  SEEK MEDICAL CARE IF:   You have increasing pain and swelling that is not controlled with your medicine.   You have uncontrolled bleeding.   You have difficulty or you are unable to have a bowel movement.   You have pain or inflammation outside the area of the hemorrhoids.   You have chills or an oral temperature above 102 F (38.9 C).  MAKE  SURE YOU:   Understand these instructions.   Will watch your condition.   Will get help right away if you are not doing well or get worse.  Document Released: 09/12/2000 Document Revised: 09/04/2011 Document Reviewed: 01/18/2008 [facility] Patient Information 2012 [place], [place].       Hemorrhoidectomy Hemorrhoidectomy is surgery to remove hemorrhoids. Hemorrhoids are veins that have become swollen in the rectum. The rectum is the area from the bottom end of the intestines to  the opening where bowel movements leave the body. Hemorrhoids can be uncomfortable. They can cause itching, bleeding and pain if a blood clot forms in them (thrombose). If hemorrhoids are small, surgery may not be needed. But if they cover a larger area, surgery is usually suggested.  LET YOUR CAREGIVER KNOW ABOUT:   Any allergies.   All medications you are taking, including:   Herbs, eyedrops, over-the-counter medications and creams.   Blood thinners (anticoagulants), aspirin or other drugs that could affect blood clotting.   Use of steroids (by mouth or as creams).   Previous problems with anesthetics, including local anesthetics.   Possibility of pregnancy, if this applies.   Any history of blood clots.   Any history of bleeding or other blood problems.   Previous surgery.   Smoking history.   Other health problems.  RISKS AND COMPLICATIONS All surgery carries some risk. However, hemorrhoid surgery usually goes smoothly. Possible complications could include:  Urinary retention.   Bleeding.   Infection.   A painful incision.   A reaction to the anesthesia (this is not common).  BEFORE THE PROCEDURE   Stop using aspirin and non-steroidal anti-inflammatory drugs (NSAIDs) for pain relief. This includes prescription drugs and over-the-counter drugs such as ibuprofen and naproxen. Also stop taking vitamin E. If possible, do this two weeks before your surgery.   If you take blood-thinners, ask your healthcare provider when you should stop taking them.   You will probably have blood and urine tests done several days before your surgery.   Do not eat or drink for about 8 hours before the surgery.   Arrive at least an hour before the surgery, or whenever your surgeon recommends. This will give you time to check in and fill out any needed paperwork.   Hemorrhoidectomy is often an outpatient procedure. This means you will be able to go home the same day. Sometimes, though,  people stay overnight in the hospital after the procedure. Ask your surgeon what to expect. Either way, make arrangements in advance for someone to drive you home.  PROCEDURE   The preparation:   You will change into a hospital gown.   You will be given an IV. A needle will be inserted in your arm. Medication can flow directly into your body through this needle.   You might be given an enema to clear your rectum.   Once in the operating room, you will probably lie on your side or be repositioned later to lying on your stomach.   You will be given anesthesia (medication) so you will not feel anything during the surgery. The surgery often is done with local anesthesia (the area near the hemorrhoids will be numb and you will be drowsy but awake). Sometimes, general anesthesia is used (you will be asleep during the procedure).   The procedure:   There are a few different procedures for hemorrhoids. Be sure to ask you surgeon about the procedure, the risks and benefits.   Be sure to ask about  what you need to do to take care of the wound, if there is one.  AFTER THE PROCEDURE  You will stay in a recovery area until the anesthesia has worn off. Your blood pressure and pulse will be checked every so often.   You may feel a lot of pain in the area of the rectum.   Take all pain medication prescribed by your surgeon. Ask before taking any over-the-counter pain medicines.   Sometimes sitting in a warm bath can help relieve your pain.   To make sure you have bowel movements without straining:   You will probably need to take stool softeners (usually a pill) for a few days.   You should drink 8 to 10 glasses of water each day.   Your activity will be restricted for awhile. Ask your caregiver for a list of what you should and should not do while you recover.  Document Released: 07/13/2009 Document Revised: 09/04/2011 Document Reviewed: 07/13/2009 [facility] Patient Information 2012 [place],  [place].

## 2012-03-22 NOTE — ED Notes (Signed)
Pt states yesterday he developed some hemorrhoids that are very painful  Pt was seen here around midnight  Pt states afterward he was given pain meds and went home  Pt states at home he took xanax 0.5mg  and went to sleep  Pt states he woke up at 0630 this am in pain and was able to urinate  Pt states he took percocet this am with some xanax this morning  Pt states he has not been able to urinate since about 0630 this am  Pt has panic attacks and has been having them today  Mother states at 4pm pt was prescribed rapiflo 4mg  by his PCP  Since 4pm he has taken 2 of these without relief

## 2012-03-23 ENCOUNTER — Telehealth: Payer: Self-pay

## 2012-03-23 NOTE — Telephone Encounter (Signed)
[facility] Triage Call Report Triage Record Num: [id number] Operator: Tana Felts Patient Name: Jay Stevenson Call Date & Time: 03/22/2012 5:24:28PM Patient Phone: [phone] PCP: Sonda Primes Patient Gender: Male PCP Fax : [phone] Patient DOB: [birth year] [facility] Name: [facility] Reason for Call: Caller: Susan/Mother; PCP: Sonda Primes; CB#: [phone]; Afebrile. Call regarding Urinary Pain/Bleeding; ED visit on 03/21/12 for bleeding and removal of hemmorroids and clots at [facility]. Pain medication given Hydrocodone 10mg . Mom called office earlier this morning d/t son stating it was difficult to urinate and he not voided since 0630. Dr. Posey Rea gave RX for Rapaflo 4mg  Take 1-2 with meal; took at 1600. He still has not been able to urinate. The RN in the office stated he should urinate within 30-1 hour of taking medication. Mom is concerned that she may need to take him back to the ED to be catherized. Called MD oncall Dr. Lovell Sheehan, left message. Second attempt to contact Dr. Lovell Sheehan, advised to send to ED for evaluation. Protocol(s) Used: Urinary Symptoms - Male Recommended Outcome per Protocol: See Provider within 4 hours Reason for Outcome: No urination for 8 or more hours Care Advice: ~ 03/22/2012 6:07:46PM Page 1 of 1 [id number]

## 2012-03-23 NOTE — Telephone Encounter (Signed)
Noted. Thx.

## 2012-03-23 NOTE — ED Notes (Signed)
Bladder scan post cath: 86mL

## 2012-03-23 NOTE — ED Provider Notes (Signed)
Medical screening examination/treatment/procedure(s) were performed by non-physician practitioner and as supervising physician I was immediately available for consultation/collaboration.  Flint Melter, MD 03/23/12 [phone]

## 2012-03-23 NOTE — ED Notes (Signed)
Pt is very anxious after foley insertion with shaking noticed. Nurse to be notified.

## 2012-03-23 NOTE — ED Provider Notes (Signed)
History     CSN: [id number]  Arrival date & time 03/22/12  1916   First MD Initiated Contact with Patient 03/23/12 0013      Chief Complaint  Patient presents with  . Urinary Retention    The history is provided by the patient.   patient was seen emergency department last night for rectal hemorrhoids.  He was sent home with Percocet for pain.  He is also on chronic Xanax for his anxiety.  He's had urinary retention since earlier this morning and when he presented to the emergency department 700 cc of urine were drained out of his bladder.  He's never had urinary retention before.  He has no other complaints.  At this time is without any symptoms.  A Foley catheter was placed at this time he reports significant discomfort from the Foley catheter.  He denies dysuria or urinary frequency.  He has no other complaints.  Past Medical History  Diagnosis Date  . Mood disorder     anger management  . Polysubstance abuse   . Alcohol abuse   . H/O: substance abuse   . Panic attack     History reviewed. No pertinent past surgical history.  Family History  Problem Relation Age of Onset  . Depression Mother   . Cancer Other   . Hypertension Other   . Stroke Other     History  Substance Use Topics  . 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 Smokeless tobacco: Not on file  . Alcohol Use: No     quit in Aug 2012      Review of Systems  All other systems reviewed and are negative.    Allergies  Review of patient's allergies indicates no known allergies.  Home Medications   Current Outpatient Rx  Name Route Sig Dispense Refill  . ALPRAZOLAM 0.5 MG PO TABS Oral Take 0.5 mg by mouth 2 (two) times daily as needed.    . OXYCODONE-ACETAMINOPHEN 10-325 MG PO TABS Oral Take 1 tablet by mouth every 4 (four) hours as needed for pain. 10 tablet 0  . SILODOSIN 4 MG PO CAPS Oral Take 1-2 capsules (4-8 mg total) by mouth daily with breakfast. 10 capsule 0  . POLYETHYLENE GLYCOL 3350 PO POWD Oral  Take 17 g by mouth daily. 255 g 0    BP 105/56  Pulse 86  Temp 97.6 F (36.4 C) (Oral)  Resp 20  SpO2 100%  Physical Exam  Constitutional: He is oriented to person, place, and time. He appears well-developed and well-nourished.  HENT:  Head: Normocephalic.  Eyes: EOM are normal.  Neck: Normal range of motion.  Pulmonary/Chest: Effort normal.  Abdominal: Soft. He exhibits no distension.  Musculoskeletal: Normal range of motion.  Neurological: He is alert and oriented to person, place, and time.  Psychiatric: He has a normal mood and affect.    ED Course  Procedures (including critical care time)  Labs Reviewed - No data to display No results found.   1. Urinary retention       MDM  A Foley catheter was placed and the patient was to be discharged home with a Foley catheter and urology followup he reported that catheter was too painful and caused too much discomfort and that he wished it to be out.  He understands the risks that he is urinary retention may return.  He was to call the urologist for followup.  He reports he will return the emergency department if he develops urinary  retention again.  The majority of this is likely medication related        Lyanne Co, MD 03/23/12 [phone]

## 2012-03-23 NOTE — Discharge Instructions (Signed)
Acute Urinary Retention, Male You have been seen by a caregiver today because of your inability to urinate (pass your water). This is a common problem in elderly males. As men age their prostates become larger and block the flow of urine from the bladder. This is usually a problem that has come on gradually. It is often first noticed by having to get up at night to urinate. This is because as the prostate enlarges it is more difficult to empty the bladder completely. Treatment may involve a one time catheterization to empty the bladder. This is putting in a tube to drain your urine. Then you and your personal caregiver can decide at your earliest convenience how to handle this problem in the future. It may also be a problem that may not recur for years. Sometimes this problem can be caused by medications. In this case, all that is often necessary is to discontinue the offending agent. If you are to leave the foley catheter (a long, narrow, hollow tube) in and go home with a drainage system, you will need to discuss the best course of action with your caregiver. While the catheter is in, maintain a good intake of fluids. Keep the drainage bag emptied and lower than your catheter. This is so contaminated (infected) urine will not be flowing back into your bladder. This could lead to a urinary tract infection. Only take over-the-counter or prescription medicines for pain, discomfort, or fever as directed by your caregiver.  SEEK IMMEDIATE MEDICAL CARE IF:  You develop chills, fever, or show signs of generalized illness that occurs prior to seeing your caregiver. Document Released: 12/22/2000 Document Revised: 09/04/2011 Document Reviewed: 09/06/2008 ExitCare Patient Information 2012 [employer]. 

## 2012-04-02 ENCOUNTER — Institutional Professional Consult (permissible substitution): Payer: 59 | Admitting: Internal Medicine

## 2012-04-14 ENCOUNTER — Encounter: Payer: Self-pay | Admitting: Internal Medicine

## 2012-04-14 ENCOUNTER — Ambulatory Visit (INDEPENDENT_AMBULATORY_CARE_PROVIDER_SITE_OTHER): Payer: 59 | Admitting: Internal Medicine

## 2012-04-14 ENCOUNTER — Ambulatory Visit: Payer: 59 | Admitting: Internal Medicine

## 2012-04-14 VITALS — BP 132/70 | HR 88 | Temp 97.7°F | Resp 16 | Wt 172.0 lb

## 2012-04-14 DIAGNOSIS — R338 Other retention of urine: Secondary | ICD-10-CM

## 2012-04-14 DIAGNOSIS — F39 Unspecified mood [affective] disorder: Secondary | ICD-10-CM

## 2012-04-14 DIAGNOSIS — F1010 Alcohol abuse, uncomplicated: Secondary | ICD-10-CM

## 2012-04-14 DIAGNOSIS — K645 Perianal venous thrombosis: Secondary | ICD-10-CM

## 2012-04-14 DIAGNOSIS — K5909 Other constipation: Secondary | ICD-10-CM

## 2012-04-14 DIAGNOSIS — F1910 Other psychoactive substance abuse, uncomplicated: Secondary | ICD-10-CM

## 2012-04-14 DIAGNOSIS — K5904 Chronic idiopathic constipation: Secondary | ICD-10-CM | POA: Insufficient documentation

## 2012-04-14 HISTORY — DX: Other retention of urine: R33.8

## 2012-04-14 HISTORY — DX: Perianal venous thrombosis: K64.5

## 2012-04-14 MED ORDER — POLYETHYLENE GLYCOL 3350 17 GM/SCOOP PO POWD: 17.0000 g | Freq: Every day | ORAL | Status: AC | PRN

## 2012-04-14 MED ORDER — VITAMIN D 1000 UNITS PO TABS: 1000.0000 [IU] | ORAL_TABLET | Freq: Every day | ORAL | Status: DC

## 2012-04-14 MED ORDER — DEPLIN 15 MG PO TABS: ORAL_TABLET | ORAL | Status: DC

## 2012-04-14 NOTE — Progress Notes (Signed)
   Subjective:    Patient ID: Jay Stevenson, male    DOB: 15-May-1991, 21 y.o.   MRN: [id number]  HPI    The patient is here to follow up on chronic mood disorder, depression, anxiety symptoms controlled with his determination to get better and exercise. He states he is doing well. He had an acute episode of thrombosed hemorrhoid req surgery and acute urinary ret req cath in 6/13  BP Readings from Last 3 Encounters:  04/14/12 132/70  03/22/12 105/56  03/22/12 159/97   Wt Readings from Last 3 Encounters:  04/14/12 172 lb (78.019 kg) (72.66%*)  12/17/11 176 lb (79.833 kg) (77.88%*)  08/20/11 171 lb (77.565 kg) (74.14%*)   * Growth percentiles are based on CDC 2-20 Years data.    Review of Systems  Constitutional: Negative for appetite change, fatigue and unexpected weight change.  HENT: Negative for nosebleeds, congestion, sore throat, sneezing, trouble swallowing and neck pain.   Eyes: Negative for itching and visual disturbance.  Respiratory: Negative for cough.   Cardiovascular: Negative for chest pain, palpitations and leg swelling.  Gastrointestinal: Negative for nausea, diarrhea, blood in stool and abdominal distention.  Genitourinary: Negative for frequency and hematuria.  Musculoskeletal: Negative for back pain, joint swelling and gait problem.  Skin: Negative for rash.  Neurological: Negative for dizziness, tremors, speech difficulty and weakness.  Psychiatric/Behavioral: Positive for behavioral problems (better, much less angry) and decreased concentration (better). Negative for suicidal ideas, hallucinations, confusion, disturbed wake/sleep cycle, self-injury, dysphoric mood and agitation. The patient is not nervous/anxious and is not hyperactive.        Not homicidal       Objective:   Physical Exam  Constitutional: He appears well-developed and well-nourished. No distress.  Cardiovascular: Normal rate.  Exam reveals no gallop.   Pulmonary/Chest: He has no wheezes.  He has no rales.  Abdominal: There is no tenderness.  Psychiatric: He has a normal mood and affect. His behavior is normal. Judgment and thought content normal.          Assessment & Plan:

## 2012-04-14 NOTE — Assessment & Plan Note (Signed)
Remains abstinent 

## 2012-04-14 NOTE — Assessment & Plan Note (Signed)
Summer 2013; seems to be related to constipation; painful - s/p incision

## 2012-04-14 NOTE — Assessment & Plan Note (Signed)
Miralax prn 

## 2012-04-14 NOTE — Assessment & Plan Note (Signed)
6/13 due to oxycodone; he had to be cathed

## 2012-04-14 NOTE — Assessment & Plan Note (Signed)
Discussed. Cont Deplin F/u w/Psychology

## 2012-05-12 ENCOUNTER — Ambulatory Visit: Payer: 59 | Admitting: Internal Medicine

## 2012-05-14 ENCOUNTER — Encounter: Payer: Self-pay | Admitting: Internal Medicine

## 2012-05-14 ENCOUNTER — Ambulatory Visit (INDEPENDENT_AMBULATORY_CARE_PROVIDER_SITE_OTHER): Payer: 59 | Admitting: Internal Medicine

## 2012-05-14 ENCOUNTER — Other Ambulatory Visit: Payer: 59

## 2012-05-14 VITALS — BP 120/98 | HR 78 | Ht 72.0 in | Wt 174.6 lb

## 2012-05-14 DIAGNOSIS — J302 Other seasonal allergic rhinitis: Secondary | ICD-10-CM

## 2012-05-14 DIAGNOSIS — J309 Allergic rhinitis, unspecified: Secondary | ICD-10-CM

## 2012-05-14 MED ORDER — AZELASTINE-FLUTICASONE 137-50 MCG/ACT NA SUSP: 1.0000 | Freq: Every day | NASAL | Status: DC

## 2012-05-14 NOTE — Patient Instructions (Addendum)
Sample and script for Dymista nasal spray   1-2 puffs each nostril every night at bedtime. Some people need to use it twice daily  Order- Allergy profile    Dx Allergic rhinitis

## 2012-05-14 NOTE — Progress Notes (Signed)
05/14/12- 20 yoM former smoker c/o wheezing, sob, and cough with yellow/ green mucus, and  chest tightness. States has had symptoms before,  but has gotten worse in the last 3 days.  Denies chest pain.   Primary complaint is nasal congestion with sore throat. Symptoms are perennial over at least the past several years. Occasional sneeze. Wakes with a sore throat. No history of asthma, myringotomy tubes or ENT surgery. Doesn't know about snoring. Experimented with cigarettes for a few months, stopping 4 months ago. No other health problems. Symptoms were improved by drinking water to ease sore throat. 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 Mother had allergy treated with allergy shots  Prior to Admission medications   Medication Sig Start Date End Date Taking? Authorizing Provider  cholecalciferol (VITAMIN D) 1000 UNITS tablet Take 1,000 Units by mouth daily. Has not started taking yet 04/14/12 04/14/13 Yes Georgina Quint Plotnikov, MD  polyethylene glycol powder (MIRALAX) powder Take 17 g by mouth. Every 2 days   Yes Historical Provider, MD  Azelastine-Fluticasone (DYMISTA) 137-50 MCG/ACT SUSP Place 1-2 puffs into the nose at bedtime. 05/14/12 05/14/13  Waymon Budge, MD   Past Medical History  Diagnosis Date  . Mood disorder     anger management  . Polysubstance abuse   . Alcohol abuse   . H/O: substance abuse   . Panic attack   . Seasonal allergies    Past Surgical History  Procedure Date  . Wisdom tooth extraction    Family History  Problem Relation Age of Onset  . Depression Mother   . Colon cancer Paternal Grandfather   . Hypertension Other   . Stroke Other   . Allergies Father   . Allergies Mother   . Asthma Father   . Other Paternal Grandfather     brain tumor   History   Social History  . Marital Status: Single    Spouse Name: N/A    Number of Children: N/A  . Years of Education: N/A   Occupational History  . Not on file.   Social History Main Topics    . Smoking status: Former Smoker -- 1 years    Types: Cigarettes    Quit date: 01/13/2012  . Smokeless tobacco: Never Used   Comment: 2-3 ciggs a week  . Alcohol Use: No     quit in Aug 2012  . Drug Use: No     quit in Aug 2012  . Sexually Active: Not on file   Other Topics Concern  . Not on file   Social History Narrative  . No narrative on file   ROS-see HPI Constitutional:   No-   weight loss, night sweats, fevers, chills, fatigue, lassitude. HEENT:   No-  headaches, difficulty swallowing, tooth/dental problems, +sore throat,       +sneezing, itching, ear ache, nasal congestion, post nasal drip,  CV:  No-   chest pain, orthopnea, PND, swelling in lower extremities, anasarca, dizziness, palpitations Resp: No-   shortness of breath with exertion or at rest.              No-   productive cough,  No non-productive cough,  No- coughing up of blood.              No-   change in color of mucus.  No- wheezing.   Skin: No-   rash or lesions. GI:  No-   heartburn, indigestion, abdominal pain, nausea, vomiting, diarrhea,  change in bowel habits, loss of appetite GU: No-   dysuria, change in color of urine, no urgency or frequency.  No- flank pain. MS:  No-   joint pain or swelling.  No- decreased range of motion.  No- back pain. Neuro-     nothing unusual Psych:  No- change in mood or affect. No depression or anxiety.  No memory loss.  OBJ- Physical Exam General- Alert, Oriented, Affect-appropriate, Distress- none acute Skin- rash-none, lesions- none, excoriation- none Lymphadenopathy- none Head- atraumatic            Eyes- Gross vision intact, PERRLA, conjunctivae and secretions clear            Ears- Hearing, canals + cerumen            Nose- + mucus bridging, no-Septal dev, mucus, polyps, erosion, perforation             Throat- Mallampati II , mucosa clear , drainage- none, tonsils 2+ Neck- flexible , trachea midline, no stridor , thyroid nl, carotid no  bruit Chest - symmetrical excursion , unlabored           Heart/CV- RRR , no murmur , no gallop  , no rub, nl s1 s2                           - JVD- none , edema- none, stasis changes- none, varices- none           Lung- clear to P&A, wheeze- none, cough- none , dullness-none, rub- none           Chest wall-  Abd- tender-no, distended-no, bowel sounds-present, HSM- no Br/ Gen/ Rectal- Not done, not indicated Extrem- cyanosis- none, clubbing, none, atrophy- none, strength- nl Neuro- grossly intact to observation

## 2012-05-18 LAB — ALLERGY FULL PROFILE
Alternaria Alternata: <0.1 kU/L
Aspergillus fumigatus, IgG: <0.1 kU/L
Bahia Grass: 0.36 kU/L — ABNORMAL HIGH
Bermuda Grass: <0.1 kU/L
Candida Albicans: <0.1 kU/L
Cat Dander: 4.81 kU/L — ABNORMAL HIGH
D. farinae: 3.36 kU/L — ABNORMAL HIGH
Dog Dander: 0.54 kU/L — ABNORMAL HIGH
Elm IgE: 0.19 kU/L — ABNORMAL HIGH
G009 Red Top: 1.21 kU/L — ABNORMAL HIGH
Lamb's Quarters: <0.1 kU/L
Plantain: <0.1 kU/L
Sycamore Tree: 0.11 kU/L — ABNORMAL HIGH

## 2012-05-20 NOTE — Progress Notes (Signed)
Quick Note:  Spoke with patients father-will relay results to patient and remind of appt. DPR on file to speak with father; pt will call the office if any questions or concerns. ______

## 2012-05-22 DIAGNOSIS — J3089 Other allergic rhinitis: Secondary | ICD-10-CM | POA: Insufficient documentation

## 2012-05-22 DIAGNOSIS — J302 Other seasonal allergic rhinitis: Secondary | ICD-10-CM | POA: Insufficient documentation

## 2012-05-22 NOTE — Assessment & Plan Note (Signed)
Mornings sore throat suggests mouth breathing and snoring at night. Plan-allergy profile. Sample trial of Dymista nasal spray.

## 2012-07-01 ENCOUNTER — Ambulatory Visit: Payer: 59 | Admitting: Internal Medicine

## 2012-07-22 ENCOUNTER — Ambulatory Visit (INDEPENDENT_AMBULATORY_CARE_PROVIDER_SITE_OTHER): Payer: 59 | Admitting: Internal Medicine

## 2012-07-22 ENCOUNTER — Encounter: Payer: Self-pay | Admitting: Internal Medicine

## 2012-07-22 ENCOUNTER — Ambulatory Visit: Payer: 59 | Admitting: Internal Medicine

## 2012-07-22 VITALS — BP 120/80 | HR 88 | Temp 96.9°F | Resp 16 | Wt 175.0 lb

## 2012-07-22 DIAGNOSIS — F39 Unspecified mood [affective] disorder: Secondary | ICD-10-CM

## 2012-07-22 DIAGNOSIS — J302 Other seasonal allergic rhinitis: Secondary | ICD-10-CM

## 2012-07-22 DIAGNOSIS — J309 Allergic rhinitis, unspecified: Secondary | ICD-10-CM

## 2012-07-22 DIAGNOSIS — Z0000 Encounter for general adult medical examination without abnormal findings: Secondary | ICD-10-CM

## 2012-07-22 DIAGNOSIS — Z23 Encounter for immunization: Secondary | ICD-10-CM

## 2012-07-22 DIAGNOSIS — J3089 Other allergic rhinitis: Secondary | ICD-10-CM

## 2012-07-22 NOTE — Progress Notes (Signed)
Subjective:    Patient ID: Jay Stevenson, male    DOB: Aug 03, 1991, 21 y.o.   MRN: [id number]  HPI    The patient is here to follow up on chronic mood disorder, depression, anxiety symptoms controlled with his determination to get better and exercise. He states he is doing well. He had an acute episode of thrombosed hemorrhoid req surgery and acute urinary ret req cath in 6/13  BP Readings from Last 3 Encounters:  07/22/12 120/80  05/14/12 120/98  04/14/12 132/70   Wt Readings from Last 3 Encounters:  07/22/12 175 lb (79.379 kg)  05/14/12 174 lb 9.6 oz (79.198 kg)  04/14/12 172 lb (78.019 kg) (72.66%*)   * Growth percentiles are based on CDC 2-20 Years data.    Review of Systems  Constitutional: Negative for appetite change, fatigue and unexpected weight change.  HENT: Negative for nosebleeds, congestion, sore throat, sneezing, trouble swallowing and neck pain.   Eyes: Negative for itching and visual disturbance.  Respiratory: Negative for cough.   Cardiovascular: Negative for chest pain, palpitations and leg swelling.  Gastrointestinal: Negative for nausea, diarrhea, blood in stool and abdominal distention.  Genitourinary: Negative for frequency and hematuria.  Musculoskeletal: Negative for back pain, joint swelling and gait problem.  Skin: Negative for rash.  Neurological: Negative for dizziness, tremors, speech difficulty and weakness.  Psychiatric/Behavioral: Positive for behavioral problems (better, much less angry) and decreased concentration (better). Negative for suicidal ideas, hallucinations, confusion, disturbed wake/sleep cycle, self-injury, dysphoric mood and agitation. The patient is not nervous/anxious and is not hyperactive.        Not homicidal       Objective:   Physical Exam  Constitutional: He appears well-developed and well-nourished. No distress.  HENT:  Head: Normocephalic.  Nose: Nose normal.  Mouth/Throat: Oropharynx is clear and moist.  Neck:  No thyromegaly present.  Cardiovascular: Normal rate.  Exam reveals no gallop.   Pulmonary/Chest: He has no wheezes. He has no rales.  Abdominal: There is no tenderness.  Musculoskeletal: He exhibits no edema.  Neurological: No cranial nerve deficit. Coordination normal.  Psychiatric: He has a normal mood and affect. His behavior is normal. Judgment and thought content normal.    Lab Results  Component Value Date   WBC 5.2 06/13/2011   HGB 15.5 06/13/2011   HCT 45.4 06/13/2011   PLT 358.0 06/13/2011   GLUCOSE 74 06/13/2011   CHOL  Value: 140        ATP III CLASSIFICATION:  <200     mg/dL   Desirable  409-811  mg/dL   Borderline High  >=914    mg/dL   High        04/06/2955   TRIG 116 04/05/2010   HDL 39 04/05/2010   LDLCALC  Value: 78        Total Cholesterol/HDL:CHD Risk Coronary Heart Disease Risk Table                     Men   Women  1/2 Average Risk   3.4   3.3  Average Risk       5.0   4.4  2 X Average Risk   9.6   7.1  3 X Average Risk  23.4   11.0        Use the calculated Patient Ratio above and the CHD Risk Table to determine the patient's CHD Risk.        ATP III CLASSIFICATION (LDL):  <100  mg/dL   Optimal  643-329  mg/dL   Near or Above                    Optimal  130-159  mg/dL   Borderline  518-841  mg/dL   High  >660     mg/dL   Very High 03/02/159   ALT 18 06/13/2011   AST 22 06/13/2011   NA 141 06/13/2011   K 3.8 06/13/2011   CL 104 06/13/2011   CREATININE 0.8 06/13/2011   BUN 14 06/13/2011   CO2 30 06/13/2011   TSH 1.62 06/13/2011   HGBA1C  Value: 5.0 (NOTE)                                                                       According to the ADA Clinical Practice Recommendations for 2011, when HbA1c is used as a screening test:   >=6.5%   Diagnostic of Diabetes Mellitus           (if abnormal result  is confirmed)  5.7-6.4%   Increased risk of developing Diabetes Mellitus  References:Diagnosis and Classification of Diabetes Mellitus,Diabetes Care,2011,34(Suppl 1):S62-S69 and Standards  of Medical Care in         Diabetes - 2011,Diabetes Care,2011,34  (Suppl 1):S11-S61. 04/05/2010         Assessment & Plan:

## 2012-07-22 NOTE — Assessment & Plan Note (Signed)
Discussed. Doing better - planning to transfer to [facility] next year

## 2012-07-22 NOTE — Assessment & Plan Note (Signed)
See Rx 

## 2012-08-13 ENCOUNTER — Encounter: Payer: Self-pay | Admitting: Internal Medicine

## 2012-08-13 ENCOUNTER — Ambulatory Visit (INDEPENDENT_AMBULATORY_CARE_PROVIDER_SITE_OTHER): Payer: 59 | Admitting: Internal Medicine

## 2012-08-13 VITALS — BP 128/78 | HR 69 | Ht 72.5 in | Wt 177.2 lb

## 2012-08-13 DIAGNOSIS — J309 Allergic rhinitis, unspecified: Secondary | ICD-10-CM

## 2012-08-13 DIAGNOSIS — J302 Other seasonal allergic rhinitis: Secondary | ICD-10-CM

## 2012-08-13 MED ORDER — MOMETASONE FUROATE 50 MCG/ACT NA SUSP: 2.0000 | Freq: Every day | NASAL | Status: DC

## 2012-08-13 MED ORDER — AZELASTINE-FLUTICASONE 137-50 MCG/ACT NA SUSP: 1.0000 | Freq: Every day | NASAL | Status: DC

## 2012-08-13 NOTE — Progress Notes (Signed)
05/14/12- 20 yoM former smoker c/o wheezing, sob, and cough with yellow/ green mucus, and  chest tightness. States has had symptoms before,  but has gotten worse in the last 3 days.  Denies chest pain.   Primary complaint is nasal congestion with sore throat. Symptoms are perennial over at least the past several years. Occasional sneeze. Wakes with a sore throat. No history of asthma, myringotomy tubes or ENT surgery. Doesn't know about snoring. Experimented with cigarettes for a few months, stopping 4 months ago. No other health problems. Symptoms were improved by drinking water to ease sore throat. 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 Mother had allergy treated with allergy shots   08/13/12-21 yoM former smoker followed for allergic rhinitis, asthma with bronchitis FOLLOWS FOR: Pt given Dymista last Ov and needs sample/refills--states helps well. Pt states that coughing and wheezing  has improved. Pt states he still has sob but with anxiety only. He is feeling fairly well. Chest feels clear. Admits some nasal congestion and drainage. He avoids decongestants because he has a "phobia" about urinary retention that occurred on a medication while in the hospital once. We discussed this.  ROS-see HPI Constitutional:   No-   weight loss, night sweats, fevers, chills, fatigue, lassitude. HEENT:   No-  headaches, difficulty swallowing, tooth/dental problems, +sore throat,       +sneezing, itching, ear ache, +nasal congestion, post nasal drip,  CV:  No-   chest pain, orthopnea, PND, swelling in lower extremities, anasarca, dizziness, palpitations Resp: No-   shortness of breath with exertion or at rest.              No-   productive cough,  No non-productive cough,  No- coughing up of blood.              No-   change in color of mucus.  No- wheezing.   Skin: No-   rash or lesions. GI:  No-   heartburn, indigestion, abdominal pain, nausea, vomiting,  GU: . MS:  No-   joint pain or  swelling.   Neuro-     nothing unusual Psych:  No- change in mood or affect. No depression or anxiety.  No memory loss.  OBJ- Physical Exam General- Alert, Oriented, Affect-appropriate, Distress- none acute Skin- rash-none, lesions- none, excoriation- none Lymphadenopathy- none Head- atraumatic            Eyes- Gross vision intact, PERRLA, conjunctivae and secretions clear            Ears- Hearing, canals + cerumen            Nose- + mucus bridging, no-Septal dev, mucus, polyps, erosion, perforation             Throat- Mallampati II-III , mucosa clear , drainage- none, tonsils 2+ Neck- flexible , trachea midline, no stridor , thyroid nl, carotid no bruit Chest - symmetrical excursion , unlabored           Heart/CV- RRR , no murmur , no gallop  , no rub, nl s1 s2                           - JVD- none , edema- none, stasis changes- none, varices- none           Lung- clear to P&A, wheeze- none, cough- none , dullness-none, rub- none           Chest  wall-  Abd-  Br/ Gen/ Rectal- Not done, not indicated Extrem- cyanosis- none, clubbing, none, atrophy- none, strength- nl Neuro- grossly intact to observation

## 2012-08-13 NOTE — Patient Instructions (Addendum)
Script for Dymista nasal spray    Sample Nasonex nasal spray   2 sprays each nostril, once daily at bedtime     Compare this to the Dymista  You can also try using an otc nasal saline spray as discussed. This can be used as often as needed.  Please call as needed

## 2012-08-22 NOTE — Assessment & Plan Note (Addendum)
Some improvement. Note his reluctance to use decongestants. Plan-encouraged use of saline nasal rinse. Discussed and refilled Dymista nasal spray, but compare Nasonex because of cost

## 2012-10-25 ENCOUNTER — Ambulatory Visit: Payer: 59 | Admitting: Internal Medicine

## 2012-10-26 ENCOUNTER — Ambulatory Visit: Payer: 59 | Admitting: Internal Medicine

## 2012-10-29 ENCOUNTER — Encounter: Payer: Self-pay | Admitting: Internal Medicine

## 2012-10-29 ENCOUNTER — Ambulatory Visit (INDEPENDENT_AMBULATORY_CARE_PROVIDER_SITE_OTHER): Payer: 59 | Admitting: Internal Medicine

## 2012-10-29 VITALS — BP 122/72 | HR 91 | Temp 97.8°F | Resp 10 | Wt 174.1 lb

## 2012-10-29 DIAGNOSIS — F411 Generalized anxiety disorder: Secondary | ICD-10-CM

## 2012-10-29 DIAGNOSIS — F419 Anxiety disorder, unspecified: Secondary | ICD-10-CM

## 2012-10-29 DIAGNOSIS — F39 Unspecified mood [affective] disorder: Secondary | ICD-10-CM

## 2012-10-29 NOTE — Assessment & Plan Note (Signed)
Much better on diet °

## 2012-10-29 NOTE — Progress Notes (Signed)
   Subjective:    HPI    The patient is here to follow up on chronic mood disorder, depression, anxiety symptoms controlled with his determination to get better and exercise. He states he is doing well. He had an acute episode of thrombosed hemorrhoid req surgery and acute urinary ret req cath in 6/13  BP Readings from Last 3 Encounters:  10/29/12 122/72  08/13/12 128/78  07/22/12 120/80   Wt Readings from Last 3 Encounters:  10/29/12 174 lb 1.3 oz (78.962 kg)  08/13/12 177 lb 3.2 oz (80.377 kg)  07/22/12 175 lb (79.379 kg)    Review of Systems  Constitutional: Negative for appetite change, fatigue and unexpected weight change.  HENT: Negative for nosebleeds, congestion, sore throat, sneezing, trouble swallowing and neck pain.   Eyes: Negative for itching and visual disturbance.  Respiratory: Negative for cough.   Cardiovascular: Negative for chest pain, palpitations and leg swelling.  Gastrointestinal: Negative for nausea, diarrhea, blood in stool and abdominal distention.  Genitourinary: Negative for frequency and hematuria.  Musculoskeletal: Negative for back pain, joint swelling and gait problem.  Skin: Negative for rash.  Neurological: Negative for dizziness, tremors, speech difficulty and weakness.  Psychiatric/Behavioral: Positive for behavioral problems (better, much less angry) and decreased concentration (better). Negative for suicidal ideas, hallucinations, confusion, sleep disturbance, self-injury, dysphoric mood and agitation. The patient is not nervous/anxious and is not hyperactive.        Not homicidal       Objective:   Physical Exam  Constitutional: He appears well-developed and well-nourished. No distress.  Cardiovascular: Normal rate.  Exam reveals no gallop.   Pulmonary/Chest: He has no wheezes. He has no rales.  Abdominal: There is no tenderness.  Psychiatric: He has a normal mood and affect. His behavior is normal. Judgment and thought content normal.           Assessment & Plan:

## 2012-10-29 NOTE — Assessment & Plan Note (Signed)
Better  

## 2013-02-18 ENCOUNTER — Ambulatory Visit (INDEPENDENT_AMBULATORY_CARE_PROVIDER_SITE_OTHER): Payer: 59 | Admitting: Internal Medicine

## 2013-02-18 ENCOUNTER — Encounter: Payer: Self-pay | Admitting: Internal Medicine

## 2013-02-18 VITALS — BP 120/70 | HR 80 | Temp 97.4°F | Resp 16 | Wt 182.0 lb

## 2013-02-18 DIAGNOSIS — Z111 Encounter for screening for respiratory tuberculosis: Secondary | ICD-10-CM

## 2013-02-18 DIAGNOSIS — Z23 Encounter for immunization: Secondary | ICD-10-CM

## 2013-02-18 NOTE — Progress Notes (Signed)
   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 He states he is doing well. He had an acute episode of thrombosed hemorrhoid req surgery and acute urinary ret req cath in 6/13  BP Readings from Last 3 Encounters:  02/18/13 142/82  10/29/12 122/72  08/13/12 128/78   Wt Readings from Last 3 Encounters:  02/18/13 182 lb (82.555 kg)  10/29/12 174 lb 1.3 oz (78.962 kg)  08/13/12 177 lb 3.2 oz (80.377 kg)    Review of Systems  Constitutional: Negative for appetite change, fatigue and unexpected weight change.  HENT: Negative for nosebleeds, congestion, sore throat, sneezing, trouble swallowing and neck pain.   Eyes: Negative for itching and visual disturbance.  Respiratory: Negative for cough.   Cardiovascular: Negative for chest pain, palpitations and leg swelling.  Gastrointestinal: Negative for nausea, diarrhea, blood in stool and abdominal distention.  Genitourinary: Negative for frequency and hematuria.  Musculoskeletal: Negative for back pain, joint swelling and gait problem.  Skin: Negative for rash.  Neurological: Negative for dizziness, tremors, speech difficulty and weakness.  Psychiatric/Behavioral: Positive for behavioral problems (better, much less angry) and decreased concentration (better). Negative for suicidal ideas, hallucinations, confusion, sleep disturbance, self-injury, dysphoric mood and agitation. The patient is not nervous/anxious and is not hyperactive.        Not homicidal       Objective:   Physical Exam  Constitutional: He appears well-developed and well-nourished. No distress.  Cardiovascular: Normal rate.  Exam reveals no gallop.   Pulmonary/Chest: He has no wheezes. He has no rales.  Abdominal: There is no tenderness.  Psychiatric: He has a normal mood and affect. His behavior is normal.  Judgment and thought content normal.          Assessment & Plan:

## 2013-02-22 ENCOUNTER — Ambulatory Visit: Payer: 59 | Admitting: *Deleted

## 2013-02-23 ENCOUNTER — Ambulatory Visit: Payer: 59 | Admitting: Internal Medicine

## 2013-03-22 ENCOUNTER — Ambulatory Visit (INDEPENDENT_AMBULATORY_CARE_PROVIDER_SITE_OTHER): Payer: 59 | Admitting: Licensed Clinical Social Worker

## 2013-03-22 DIAGNOSIS — F331 Major depressive disorder, recurrent, moderate: Secondary | ICD-10-CM

## 2013-03-22 DIAGNOSIS — F411 Generalized anxiety disorder: Secondary | ICD-10-CM

## 2013-03-28 ENCOUNTER — Ambulatory Visit (INDEPENDENT_AMBULATORY_CARE_PROVIDER_SITE_OTHER): Payer: 59 | Admitting: Licensed Clinical Social Worker

## 2013-03-28 DIAGNOSIS — F331 Major depressive disorder, recurrent, moderate: Secondary | ICD-10-CM

## 2013-03-28 DIAGNOSIS — F411 Generalized anxiety disorder: Secondary | ICD-10-CM

## 2013-03-29 ENCOUNTER — Ambulatory Visit (INDEPENDENT_AMBULATORY_CARE_PROVIDER_SITE_OTHER): Payer: 59 | Admitting: Licensed Clinical Social Worker

## 2013-03-29 DIAGNOSIS — F331 Major depressive disorder, recurrent, moderate: Secondary | ICD-10-CM

## 2013-03-29 DIAGNOSIS — F411 Generalized anxiety disorder: Secondary | ICD-10-CM

## 2013-03-30 ENCOUNTER — Ambulatory Visit (INDEPENDENT_AMBULATORY_CARE_PROVIDER_SITE_OTHER): Payer: 59 | Admitting: Licensed Clinical Social Worker

## 2013-03-30 DIAGNOSIS — F331 Major depressive disorder, recurrent, moderate: Secondary | ICD-10-CM

## 2013-03-30 DIAGNOSIS — F411 Generalized anxiety disorder: Secondary | ICD-10-CM

## 2013-04-04 ENCOUNTER — Ambulatory Visit: Payer: 59 | Admitting: Licensed Clinical Social Worker

## 2013-04-06 ENCOUNTER — Ambulatory Visit (INDEPENDENT_AMBULATORY_CARE_PROVIDER_SITE_OTHER): Payer: 59 | Admitting: Licensed Clinical Social Worker

## 2013-04-06 DIAGNOSIS — F331 Major depressive disorder, recurrent, moderate: Secondary | ICD-10-CM

## 2013-04-06 DIAGNOSIS — F411 Generalized anxiety disorder: Secondary | ICD-10-CM

## 2013-04-15 ENCOUNTER — Ambulatory Visit: Payer: 59 | Admitting: Licensed Clinical Social Worker

## 2013-04-18 ENCOUNTER — Ambulatory Visit: Payer: 59 | Admitting: Licensed Clinical Social Worker

## 2013-06-27 ENCOUNTER — Encounter (HOSPITAL_COMMUNITY): Payer: Self-pay | Admitting: *Deleted

## 2013-06-27 ENCOUNTER — Inpatient Hospital Stay (HOSPITAL_COMMUNITY)
Admission: RE | Admit: 2013-06-27 | Discharge: 2013-07-01 | DRG: 885 | Disposition: A | Discharge status: Home / Self Care | Payer: 59 | Attending: Psychiatry | Admitting: Psychiatry

## 2013-06-27 DIAGNOSIS — R45851 Suicidal ideations: Secondary | ICD-10-CM

## 2013-06-27 DIAGNOSIS — Z598 Other problems related to housing and economic circumstances: Secondary | ICD-10-CM

## 2013-06-27 DIAGNOSIS — F913 Oppositional defiant disorder: Secondary | ICD-10-CM | POA: Diagnosis present

## 2013-06-27 DIAGNOSIS — F909 Attention-deficit hyperactivity disorder, unspecified type: Secondary | ICD-10-CM | POA: Diagnosis present

## 2013-06-27 DIAGNOSIS — F1911 Other psychoactive substance abuse, in remission: Secondary | ICD-10-CM | POA: Diagnosis present

## 2013-06-27 DIAGNOSIS — K589 Irritable bowel syndrome without diarrhea: Secondary | ICD-10-CM

## 2013-06-27 DIAGNOSIS — F419 Anxiety disorder, unspecified: Secondary | ICD-10-CM

## 2013-06-27 DIAGNOSIS — F1010 Alcohol abuse, uncomplicated: Secondary | ICD-10-CM

## 2013-06-27 DIAGNOSIS — F1910 Other psychoactive substance abuse, uncomplicated: Secondary | ICD-10-CM

## 2013-06-27 DIAGNOSIS — R270 Ataxia, unspecified: Secondary | ICD-10-CM

## 2013-06-27 DIAGNOSIS — F39 Unspecified mood [affective] disorder: Secondary | ICD-10-CM

## 2013-06-27 DIAGNOSIS — Z79899 Other long term (current) drug therapy: Secondary | ICD-10-CM

## 2013-06-27 DIAGNOSIS — K5900 Constipation, unspecified: Secondary | ICD-10-CM | POA: Diagnosis present

## 2013-06-27 DIAGNOSIS — K645 Perianal venous thrombosis: Secondary | ICD-10-CM

## 2013-06-27 DIAGNOSIS — Z5987 Material hardship due to limited financial resources, not elsewhere classified: Secondary | ICD-10-CM

## 2013-06-27 DIAGNOSIS — F411 Generalized anxiety disorder: Secondary | ICD-10-CM | POA: Diagnosis present

## 2013-06-27 DIAGNOSIS — R338 Other retention of urine: Secondary | ICD-10-CM

## 2013-06-27 DIAGNOSIS — K5904 Chronic idiopathic constipation: Secondary | ICD-10-CM | POA: Diagnosis present

## 2013-06-27 DIAGNOSIS — J302 Other seasonal allergic rhinitis: Secondary | ICD-10-CM

## 2013-06-27 DIAGNOSIS — F332 Major depressive disorder, recurrent severe without psychotic features: Principal | ICD-10-CM | POA: Diagnosis present

## 2013-06-27 HISTORY — DX: Irritable bowel syndrome, unspecified: K58.9

## 2013-06-27 HISTORY — DX: Irritable bowel syndrome without diarrhea: K58.9

## 2013-06-27 MED ORDER — TRAZODONE HCL 50 MG PO TABS
50.0000 mg | ORAL_TABLET | Freq: Every evening | ORAL | Status: DC | PRN
  Administered 2013-06-27 – 2013-06-30 (×4): 50 mg via ORAL
  Filled 2013-06-27 (×4): qty 1

## 2013-06-27 MED ORDER — FLUTICASONE PROPIONATE 50 MCG/ACT NA SUSP
2.0000 | Freq: Every day | NASAL | Status: DC
  Administered 2013-06-28 – 2013-07-01 (×4): 2 via NASAL
  Filled 2013-06-27: qty 16

## 2013-06-27 MED ORDER — OMEGA-3 FATTY ACIDS 1000 MG PO CAPS
4.0000 | ORAL_CAPSULE | Freq: Every day | ORAL | Status: DC
  Administered 2013-06-28 – 2013-06-30 (×3): 4 g via ORAL
  Filled 2013-06-27 (×4): qty 4

## 2013-06-27 MED ORDER — VITAMIN B-1 100 MG PO TABS
100.0000 mg | ORAL_TABLET | Freq: Every day | ORAL | Status: DC
  Administered 2013-06-28 – 2013-07-01 (×4): 100 mg via ORAL
  Filled 2013-06-27 (×6): qty 1

## 2013-06-27 MED ORDER — MAGNESIUM HYDROXIDE 400 MG/5ML PO SUSP: 30.0000 mL | Freq: Every day | ORAL | Status: DC | PRN

## 2013-06-27 MED ORDER — ACETAMINOPHEN 325 MG PO TABS: 650.0000 mg | ORAL_TABLET | Freq: Four times a day (QID) | ORAL | Status: DC | PRN

## 2013-06-27 MED ORDER — INFLUENZA VAC SPLIT QUAD 0.5 ML IM SUSP
0.5000 mL | INTRAMUSCULAR | Status: AC
  Administered 2013-06-28: 0.5 mL via INTRAMUSCULAR
  Filled 2013-06-27: qty 0.5

## 2013-06-27 MED ORDER — ALUM & MAG HYDROXIDE-SIMETH 200-200-20 MG/5ML PO SUSP: 30.0000 mL | ORAL | Status: DC | PRN

## 2013-06-27 NOTE — Treatment Plan (Signed)
Pt's parents reported pt has an appt tomorrow with a Nilsa Nutting, a therapist who specializes in DBT.  They also reports a recent regression in behavior; missing or late to classes at [employer], refusing to take prozac and latuda, difficulty with peer relationships/social problems.

## 2013-06-27 NOTE — Tx Team (Signed)
Initial Interdisciplinary Treatment Plan  PATIENT STRENGTHS: (choose at least two) Ability for insight Average or above average intelligence Capable of independent living Communication skills General fund of knowledge Motivation for treatment/growth Physical Health Special hobby/interest Work skills  PATIENT STRESSORS: Financial difficulties Legal issue Loss of cat died recently Marital or family conflict Occupational concerns   PROBLEM LIST: Problem List/Patient Goals Date to be addressed Date deferred Reason deferred Estimated date of resolution  "I believe I'm at a high risk for suicide" 06/27/13     "Extereme stress and irritability 06/27/13           Increased risk for suicide 06/27/13     Depression 06/27/13                              DISCHARGE CRITERIA:  Ability to meet basic life and health needs Adequate post-discharge living arrangements Improved stabilization in mood, thinking, and/or behavior Medical problems require only outpatient monitoring Motivation to continue treatment in a less acute level of care Need for constant or close observation no longer present Reduction of life-threatening or endangering symptoms to within safe limits Safe-care adequate arrangements made Verbal commitment to aftercare and medication compliance  PRELIMINARY DISCHARGE PLAN: Outpatient therapy Participate in family therapy  PATIENT/FAMIILY INVOLVEMENT: This treatment plan has been presented to and reviewed with the patient, Jay Stevenson, and/or family member.  The patient and family have been given the opportunity to ask questions and make suggestions.  [name] 06/27/2013, 8:06 PM

## 2013-06-27 NOTE — BH Assessment (Signed)
Assessment Note  Jay Stevenson is a 22 y.o. single white male.  Pt states in the form that he filled out at [facility], "I'm on the verge of suicide, with nowhere to turn.  I feel like I'm about to lose it.  I've lost my job, had to drop school and I feel like a ticking time bomb.  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  He had performed well in his first year, but started falling behind resulting in stress.  He recently dropped out.  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  Pt states, "It was a bunch of lies."  Nonetheless, he was recently terminated.  Pt has financial difficulties, necessitating living with his parents, despite having ongoing conflict with his mother.  It should be noted that prior to performing assessment this writer received a call from pt's mother complaining of the financial difficulties that it would create for the family if pt were admitted, and expressing her preference that more affordable, but unspecified, treatment options be pursued.  Pt also reports deaths of several family members in recent years, culminating in the recent death of his pet cat that he has had for many years.  Finally, pt reports that he has an outstanding DUI charge in the state of [place].  He believes that it will be dismissed for lack of evidence, but the court date keeps being pushed back, and is now scheduled for some time in 07/2013.  Lethality: Suicidality: Pt endorsed SI with plan to jump from an elevation.  He reports a history of a suicide attempt by overdose at 22 y/o.  He reports a history of self mutilation by burning himself with cigarettes, cutting himself (most recently in  03/2013), and having other people cut him (most recently 2 years ago).  He present at [facility] [employer] today instead of harming himself, stating, "Maybe there's a last resort." Homicidality: Pt denies homicidal thoughts or physical aggression.  He reports that he has had thoughts of hitting people, but has handled these by hitting inanimate objects instead.  Pt denies having access to firearms.  He endorses the aforesaid legal problems with the DUI in [place], not for any violent crimes.  Pt is calm and cooperative during assessment. Psychosis: Pt denies hallucinations.  Pt does not appear to be responding to internal stimuli and exhibits no delusional thought.  Pt's reality testing appears to be intact. Substance Abuse: Pt reports that in the past he has abused alcohol, cannabis, benzodiazepines, and amphetamines prescribed for ADHD.  However, he reports that he has been sober for the past 2 years, and in fact, stopped smoking cigarettes 2 months ago.  Pt does not appear to be intoxicated or in withdrawal at this time.  Social Supports: Pt identifies his girlfriend as his main social support, however, she lives in [place].  He identifies his father as a partial support, noting that he has been verbally abusive in the past.  However, he lives in the same household with both parents, and has had conflict with his mother for many years.  Treatment History: Pt was admitted to [facility] twice when he was 22 y/o, once for  the aforementioned suicide attempt by overdose, and once for behavioral problems.  He has seen many outpatient providers since he was 22 y/o.  Psychiatrists have included Phillip Heal, MD and Tiajuana Amass, MD as well as others he cannot name.  He has also seen several therapists, including Judithe Modest.  He has an appointment to see another therapist starting later this week, but cannot remember the name.  Today pt is willing to consent for admission to [facility] if it is found to be in his interest.  Axis I:  Major Depressive Disorder, recurrent, severe, without psychotic features 296.33 Axis II: Deferred 799.9 Axis III:  Past Medical History  Diagnosis Date  . Mood disorder     anger management  . Polysubstance abuse   . Alcohol abuse   . H/O: substance abuse   . Panic attack   . Seasonal allergies   . Irritable bowel syndrome 06/27/2013   Axis IV: economic problems, educational problems, housing problems, occupational problems, problems related to legal system/crime and problems with primary support group Axis V: GAF = 35  Past Medical History:  Past Medical History  Diagnosis Date  . Mood disorder     anger management  . Polysubstance abuse   . Alcohol abuse   . H/O: substance abuse   . Panic attack   . Seasonal allergies   . Irritable bowel syndrome 06/27/2013    Past Surgical History  Procedure Laterality Date  . Wisdom tooth extraction      Family History:  Family History  Problem Relation Age of Onset  . Depression Mother   . Colon cancer Paternal Grandfather   . Hypertension Other   . Stroke Other   . Allergies Father   . Allergies Mother   . Asthma Father   . Other Paternal Grandfather     brain tumor    Social History:  reports that he quit smoking about 17 months ago. His smoking use included Cigarettes. He smoked 0.00 packs per day for 1 year. He has never used smokeless tobacco. He reports that he does not drink alcohol or use illicit drugs.  Additional Social History:  Alcohol / Drug Use Pain Medications: Denies Prescriptions: Hx of abusing amphetamines and benzodiazepines Over the Counter: Denies History of alcohol / drug use?:  (Hx of abusing ETOH, THC, benzos, & ADHD Rx ending 2 yrs ago.) Longest period of sobriety (when/how long): Past 2 years  CIWA:   COWS:    Allergies: No Known Allergies  Home Medications:  Medications Prior to Admission  Medication Sig Dispense Refill  . Azelastine-Fluticasone (DYMISTA) 137-50 MCG/ACT SUSP Place 1-2  puffs into the nose at bedtime.  1 Bottle  prn  . fish oil-omega-3 fatty acids 1000 MG capsule Take 4 capsules by mouth daily.      Marland Kitchen FLUoxetine (PROZAC) 20 MG tablet Take 20 mg by mouth daily.      . mometasone (NASONEX) 50 MCG/ACT nasal spray Place 2 sprays into the nose daily.  17 g  0  . polyethylene glycol powder (MIRALAX) powder Take 17 g by mouth. Every 2 days      . thiamine (VITAMIN B-1) 100 MG tablet Take 100 mg by mouth daily.      . Zinc 15 MG CAPS Take 1 capsule by mouth daily.        OB/GYN Status:  No LMP for male patient.  General Assessment Data Location of Assessment: [facility] Assessment Services Is this a Tele or Face-to-Face Assessment?: Face-to-Face  Is this an Initial Assessment or a Re-assessment for this encounter?: Initial Assessment Living Arrangements: Parent (Biological mother and father) Can pt return to current living arrangement?:  ("I'm not sure yet.") Admission Status: Voluntary Is patient capable of signing voluntary admission?: Yes Transfer from: Home Referral Source: Self/Family/Friend  Medical Screening Exam [facility] Walk-in ONLY) Medical Exam completed: No  [facility] Crisis Care Plan Living Arrangements: Parent (Biological mother and father) Name of Psychiatrist: None Name of Therapist: Unnamed therapist, to start this week  Education Status Is patient currently in school?: No Highest grade of school patient has completed: Some college ([employer])  Risk to self Suicidal Ideation: Yes-Currently Present Suicidal Intent: No Is patient at risk for suicide?: Yes Suicidal Plan?: Yes-Currently Present Specify Current Suicidal Plan: Jump from elevation. Access to Means: Yes Specify Access to Suicidal Means: Elevations What has been your use of drugs/alcohol within the last 12 months?: Hx of ETOH, THC, Benzos, ADHD Rx; sober x 2 yrs. Previous Attempts/Gestures: Yes How many times?: 1 (OD 3 or 4 years ago) Other Self Harm Risks: "I'm on the verge of suicide with  nowhere to turn." Triggers for Past Attempts: Other (Comment) ("Same sort of stuff."  Family Px, quality of life.) Intentional Self Injurious Behavior: Burning;Cutting Comment - Self Injurious Behavior: Burning w/ cigarettes, cutting self & having others cut him, most recently 03/2013 Family Suicide History: Yes (Aunt: failed attempt by OD in 2010) Recent stressful life event(s): Other (Comment);Legal Issues;Financial Problems;Job Loss (Quit [employer] due to stress) Persecutory voices/beliefs?: No Depression: Yes Depression Symptoms: Isolating;Fatigue;Guilt;Feeling worthless/self pity;Feeling angry/irritable (Hopelessness) Substance abuse history and/or treatment for substance abuse?: Yes (Hx of ETOH, THC, Benzos, ADHD Rx; sober x 2 yrs.) Suicide prevention information given to non-admitted patients: Yes  Risk to Others Homicidal Ideation: No Thoughts of Harm to Others: No Current Homicidal Intent: No Current Homicidal Plan: No Access to Homicidal Means: No Identified Victim: None History of harm to others?: No Assessment of Violence: None Noted Violent Behavior Description: Reports violent thoughts of hitting people, but no behavior; calm/cooperative during assessment Does patient have access to weapons?: No (Denies having firearms) Criminal Charges Pending?: Yes Describe Pending Criminal Charges: DUI charge in [place]; pt believes it will be dismissed. Does patient have a court date: Yes Court Date:  (In 07/2013)  Psychosis Hallucinations: None noted Delusions: None noted  Mental Status Report Appear/Hygiene: Other (Comment) (Casual) Eye Contact: Fair Motor Activity: Restlessness (Fidgety) Speech: Other (Comment) (Unremarkable) Level of Consciousness: Alert Mood: Depressed;Anxious Affect: Other (Comment) (Constricted) Anxiety Level: Moderate Thought Processes: Coherent;Relevant Judgement: Unimpaired Orientation: Person;Place;Time;Situation Obsessive Compulsive  Thoughts/Behaviors: None  Cognitive Functioning Concentration: Decreased (...while at school) Memory: Recent Intact;Remote Intact IQ: Average Insight: Good Impulse Control: Poor (Driving, walking on edge of a cliff recently) Appetite: Fair (Diminished for past couple days) Weight Loss: 0 Weight Gain: 0 Sleep: No Change Total Hours of Sleep:  (5 - 8 hrs/night) Vegetative Symptoms: None  ADLScreening [facility] Assessment Services) Patient's cognitive ability adequate to safely complete daily activities?: Yes Patient able to express need for assistance with ADLs?: Yes Independently performs ADLs?: Yes (appropriate for developmental age)  Prior Inpatient Therapy Prior Inpatient Therapy: Yes Prior Therapy Dates: 22 y/o: [facility] x 2 for suicide attempt, behavior problems  Prior Outpatient Therapy Prior Outpatient Therapy: Yes Prior Therapy Dates: Hx of several psychiatrists: Dr Madaline Guthrie, Dr Tomasa Rand, several others; none currently Prior Therapy Facilty/Provider(s): Recently: Judithe Modest; was to start w/ an unnamed therapist later this week Reason for Treatment: No history of 12-Step  meetings  ADL Screening (condition at time of admission) Patient's cognitive ability adequate to safely complete daily activities?: Yes Is the patient deaf or have difficulty hearing?: No Does the patient have difficulty seeing, even when wearing glasses/contacts?: No Does the patient have difficulty concentrating, remembering, or making decisions?: No Patient able to express need for assistance with ADLs?: Yes Does the patient have difficulty dressing or bathing?: No Independently performs ADLs?: Yes (appropriate for developmental age) Does the patient have difficulty walking or climbing stairs?: No Weakness of Legs: None Weakness of Arms/Hands: None  Home Assistive Devices/Equipment Home Assistive Devices/Equipment: None    Abuse/Neglect Assessment (Assessment to be complete while patient is  alone) Physical Abuse: Denies Verbal Abuse: Yes, past (Comment);Yes, present (Comment) (Mom: ongoing; Dad: past; School: past.) Sexual Abuse: Denies Exploitation of patient/patient's resources: Denies Self-Neglect: Denies     Merchant navy officer (For Healthcare) Advance Directive: Patient does not have advance directive;Patient would like information Patient requests advance directive information: Advance directive packet given Pre-existing out of facility DNR order (yellow form or pink MOST form): No Nutrition Screen- [facility] Adult/[facility]/AP Patient's home diet: Regular  Additional Information 1:1 In Past 12 Months?: No CIRT Risk: No Elopement Risk: No Does patient have medical clearance?: No     Disposition:  Disposition Initial Assessment Completed for this Encounter: Yes Disposition of Patient: Inpatient treatment program Type of inpatient treatment program: Adult After reviewing pt with Janann August, NP, it has been determined that pt presents a danger to self, requiring psychiatric hospitalization.  Pt admitted to Rm 503-1 to the service of Leata Mouse, MD.  Pt signed Voluntary Admission and Consent for Treatment.  On Site Evaluation by:   Reviewed with Physician:    Doylene Canning, MA Triage Specialist Raphael Gibney 06/27/2013 7:52 PM

## 2013-06-28 DIAGNOSIS — F332 Major depressive disorder, recurrent severe without psychotic features: Principal | ICD-10-CM

## 2013-06-28 DIAGNOSIS — F1910 Other psychoactive substance abuse, uncomplicated: Secondary | ICD-10-CM

## 2013-06-28 DIAGNOSIS — F411 Generalized anxiety disorder: Secondary | ICD-10-CM

## 2013-06-28 DIAGNOSIS — F913 Oppositional defiant disorder: Secondary | ICD-10-CM

## 2013-06-28 DIAGNOSIS — F909 Attention-deficit hyperactivity disorder, unspecified type: Secondary | ICD-10-CM

## 2013-06-28 LAB — URINALYSIS, DIPSTICK ONLY
Bilirubin Urine: NEGATIVE
Glucose, UA: NEGATIVE mg/dL
Hgb urine dipstick: NEGATIVE
Ketones, ur: NEGATIVE mg/dL
Nitrite: NEGATIVE
Specific Gravity, Urine: 1.025 (ref 1.005–1.030)
Urobilinogen, UA: 0.2 mg/dL (ref 0.0–1.0)
pH: 7 (ref 5.0–8.0)

## 2013-06-28 LAB — RAPID URINE DRUG SCREEN, HOSP PERFORMED
Amphetamines: NOT DETECTED
Barbiturates: NOT DETECTED
Benzodiazepines: NOT DETECTED

## 2013-06-28 MED ORDER — POLYETHYLENE GLYCOL 3350 17 G PO PACK: 17.0000 g | PACK | Freq: Every day | ORAL | Status: DC

## 2013-06-28 MED ORDER — ENSURE COMPLETE PO LIQD
237.0000 mL | Freq: Three times a day (TID) | ORAL | Status: DC
  Administered 2013-06-28 – 2013-07-01 (×10): 237 mL via ORAL

## 2013-06-28 MED ORDER — POLYETHYLENE GLYCOL 3350 17 G PO PACK
17.0000 g | PACK | Freq: Every day | ORAL | Status: DC | PRN
  Administered 2013-06-28 – 2013-06-30 (×3): 17 g via ORAL
  Filled 2013-06-28 (×3): qty 1

## 2013-06-28 NOTE — Progress Notes (Addendum)
D:  Patient's self inventory sheet, patient sleeps well, improving appetite, normal energy level, poor attention span.  Rated depression 5, hopelessness #9, anxiety #2.  Denied withdrawals.  SI off/on, contracts for safety.  Denied physical problems.  Worst pain #1.  After discharge, will get more sleep and stay hydrated.  No questions for staff.   A:  Medications administered per MD orders.  Emotional support and encouragement given patient. R:  SI off/on, contracts for safety.  No plan.  Denied HI.  Denied A/V hallucinations.  Will continue to monitor for safety with 15 minute checks.  Safety maintained.
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
## 2013-06-28 NOTE — Progress Notes (Signed)
Recreation Therapy Notes  Date: 09.30.2014 Time: 2:45pm Location: [address] Dayroom  Group Topic: Animal Assisted Activities (AAA)  Behavioral Response: Engaged, Attentive, Appropriate  Affect: Euthymic  Clinical Observations/Feedback: Dog Team: Raliegh & handler. Patient interacted appropriately with peer, dog team, LRT and MHT.   Kaprice Kage L Ezmeralda Stefanick, LRT/CTRS   Jaclene Bartelt L 06/28/2013 4:18 PM 

## 2013-06-28 NOTE — Progress Notes (Signed)
Patient ID: Jay Stevenson, male   DOB: [birth year], 22 y.o.   MRN: [id number]   D: Pt was pleasant and cooperative, but anxious during the adm process. Initially when asked the circumstances surrounding his adm, pt was reluctant to speak. Stated, "just some stressors". Eventually, pt informed the writer that he was here as an Energy manager at 22 yrs old. States he recently lost his job, and his cat just died. States lives with his parents, but plans to discharge to his grandmother's house. 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 Pt stated he's been having self harm thoughts and decided to come to a "safe place".  Pt was apprehensive on the unit, and admitted to being "anxious". Writer encouraged pt to sit in the dayroom even if he didn't speak to anyone.   A:  Support and encouragement was offered. 15 min checks continued for safety.  R: Pt remains safe.

## 2013-06-28 NOTE — Progress Notes (Signed)
Topic/Focus:  Orientation: The focus of this group is to educate the patient on the purpose and policies of crisis stabilization and provide a format to answer questions about their admission. The group details unit policies and expectations of patients while admitted.  Participation Level: Active  Participation Quality: Appropriate, Sharing and Supportive  Affect: Appropriate and Excited  Cognitive: Appropriate  Insight: Appropriate  Engagement in Group: Engaged and Supportive  Modes of Intervention: Education and Orientation  Additional Comments: pt was very involved and participated well.     

## 2013-06-28 NOTE — [facility] Group Notes (Signed)
[facility] LCSW Group Therapy      Feelings About Diagnosis 1:15 - 2:30 PM         06/28/2013  3:20 PM    Type of Therapy:  Group Therapy  Participation Level:  Minimal  Participation Quality:  Appropriate  Affect:  Appropriate  Cognitive:  Alert and Appropriate  Insight:  Developing/Improving  Engagement in Therapy:  Developing/Improving Modes of Intervention:  Discussion, Education, Exploration, Problem-Solving, Rapport Building, Support  Summary of Progress/Problems:  Patient was attentive to conversation but did not engage in discussion. Wynn Banker 06/28/2013  3:20 PM

## 2013-06-28 NOTE — Progress Notes (Signed)
Adult Psychoeducational Group Note  Date:  06/28/2013 Time:  11:00am Group Topic/Focus:  Recovery Goals:   The focus of this group is to identify appropriate goals for recovery and establish a plan to achieve them.  Participation Level:  Active  Participation Quality:  Appropriate and Attentive  Affect:  Appropriate  Cognitive:  Appropriate  Insight: Appropriate  Engagement in Group:  Engaged  Modes of Intervention:  Discussion and Education  Additional Comments:  Pt attended and participated in group. When ask what would make his life satisfying pt stated being employed, going somewhere in life, not being so sensitive, angry or suicidal. P also stated he did not want to take everything to heart or the wrong way.  Shelly Bombard D 06/28/2013, 1:04 PM
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
## 2013-06-28 NOTE — Progress Notes (Signed)
Nutrition Brief Note  Patient identified on the Malnutrition Screening Tool (MST) Report.  Wt Readings from Last 10 Encounters:  02/18/13 182 lb (82.555 kg)  10/29/12 174 lb 1.3 oz (78.962 kg)  08/13/12 177 lb 3.2 oz (80.377 kg)  07/22/12 175 lb (79.379 kg)  05/14/12 174 lb 9.6 oz (79.198 kg) (75%*, Z = 0.68)  04/14/12 172 lb (78.019 kg) (73%*, Z = 0.60)  12/17/11 176 lb (79.833 kg) (78%*, Z = 0.77)  08/20/11 171 lb (77.565 kg) (74%*, Z = 0.65)  06/13/11 162 lb (73.483 kg) (64%*, Z = 0.36)   * Growth percentiles are based on CDC 2-20 Years data.   There is no weight on file to calculate BMI.  Recommend nursing get updated weight for pt.   Discussed intake PTA with patient and compared to intake presently.  Met with pt who reports eating barely 2 meals/day for the past 2 weeks with 7-12 pound unintended weight loss. C/o poor appetite since admission with intake <50% of meals. Interested in getting Ensure Complete for additional nutrition.   Current diet order is regular and pt is also offered choice of unit snacks mid-morning and mid-afternoon.  Pt is eating as desired.   Labs and medications reviewed.   Nutrition Dx:  Unintended wt change r/t suboptimal oral intake AEB pt report  Interventions:   Discussed the importance of nutrition and encouraged intake of food and beverages.     Supplements: Ensure Complete TID   No additional nutrition interventions warranted at this time. If nutrition issues arise, please consult RD.   Levon Hedger MS, RD, LDN [phone] Pager [phone] After Hours Pager

## 2013-06-28 NOTE — Progress Notes (Signed)
Chaplain introduced spiritual care as resource while rounding on 500 hallway.  Informed pt of services and how to contact chaplain.  Will follow up to assess for support needs.    Belva Crome MDiv

## 2013-06-28 NOTE — [facility] Group Notes (Signed)
Adult Psychoeducational Group Note  Date:  06/28/2013 Time:  9:30 PM  Group Topic/Focus:  Wrap-Up Group:   The focus of this group is to help patients review their daily goal of treatment and discuss progress on daily workbooks.  Participation Level:  Minimal  Participation Quality:  Attentive  Affect:  Flat  Cognitive:  Appropriate  Insight: Appropriate  Engagement in Group:  Limited  Modes of Intervention:  Discussion  Additional Comments:  Jay Stevenson was quiet but would answer when prompted.  Jay Stevenson A 06/28/2013, 9:30 PM

## 2013-06-29 LAB — CBC WITH DIFFERENTIAL/PLATELET
Basophils Absolute: 0 10*3/uL (ref 0.0–0.1)
Basophils Relative: 0 % (ref 0–1)
Eosinophils Absolute: 0.2 10*3/uL (ref 0.0–0.7)
Eosinophils Relative: 3 % (ref 0–5)
HCT: 42.1 % (ref 39.0–52.0)
Lymphs Abs: 2.4 10*3/uL (ref 0.7–4.0)
MCH: 31.6 pg (ref 26.0–34.0)
MCHC: 35.6 g/dL (ref 30.0–36.0)
Monocytes Absolute: 0.9 10*3/uL (ref 0.1–1.0)
Monocytes Relative: 14 % — ABNORMAL HIGH (ref 3–12)
Neutro Abs: 3.3 10*3/uL (ref 1.7–7.7)
Neutrophils Relative %: 48 % (ref 43–77)
RDW: 11.9 % (ref 11.5–15.5)

## 2013-06-29 LAB — COMPREHENSIVE METABOLIC PANEL
AST: 19 U/L (ref 0–37)
Albumin: 4 g/dL (ref 3.5–5.2)
BUN: 12 mg/dL (ref 6–23)
CO2: 28 mEq/L (ref 19–32)
Calcium: 9.7 mg/dL (ref 8.4–10.5)
Chloride: 101 mEq/L (ref 96–112)
Creatinine, Ser: 0.98 mg/dL (ref 0.50–1.35)
GFR calc non Af Amer: >90 mL/min (ref >90)
Glucose, Bld: 88 mg/dL (ref 70–99)
Total Protein: 7 g/dL (ref 6.0–8.3)

## 2013-06-29 LAB — LIPID PANEL
Cholesterol: 161 mg/dL (ref 0–200)
HDL: 45 mg/dL (ref >39)
LDL Cholesterol: 93 mg/dL (ref 0–99)
Total CHOL/HDL Ratio: 3.6 RATIO
Triglycerides: 117 mg/dL (ref <150)
VLDL: 23 mg/dL (ref 0–40)

## 2013-06-29 LAB — TSH: TSH: 4.547 u[IU]/mL — ABNORMAL HIGH (ref 0.350–4.500)

## 2013-06-29 NOTE — Progress Notes (Signed)
Patient ID: Jay Stevenson, male   DOB: May 20, 1991, 22 y.o.   MRN: [id number]  D:  Pt endorses passive HI, but contracts for safety.Pt denies SI/AVH. Pt is pleasant and cooperative.   A: Pt was offered support and encouragement. Pt was given scheduled medications. Pt was encourage to attend groups. Q 15 minute checks were done for safety.  R:Pt attends groups and interacts well with peers and staff. Pt is taking medication. Pt has no complaints at this time.Pt receptive to treatment and safety maintained on unit.

## 2013-06-29 NOTE — Tx Team (Signed)
Interdisciplinary Treatment Plan Update   Date Reviewed:  06/29/2013  Time Reviewed:  9:34 AM  Progress in Treatment:   Attending groups: Yes Participating in groups: Yes Taking medication as prescribed: Yes  Tolerating medication: Yes Family/Significant other contact made: Yes, contact made with family Patient understands diagnosis: Yes  Discussing patient identified problems/goals with staff: Yes Medical problems stabilized or resolved: Yes Denies suicidal/homicidal ideation: Yes Patient has not harmed self or others: Yes  For review of initial/current patient goals, please see plan of care.  Estimated Length of Stay:  2-4 days  Reasons for Continued Hospitalization:  Anxiety Depression Medication stabilization   New Problems/Goals identified:    Discharge Plan or Barriers:   Home with outpatient follow up with [facility].  Additional Comments:  Jay Stevenson is a 22 y.o. single white male admitted voluntarily from [facility] assessment office for depression and suicidal ideations. He reports that "I am on the verge of suicide, wit nowhere to turn. I feel like I'm about to lose it. I've lost my job, had to drop school and I feel like a ticking time bomb. I have nowhere to fall so I'm here."    Attendees:  Patient:  06/29/2013 9:34 AM   Signature: Mervyn Gay, MD 06/29/2013 9:34 AM  Signature:  Verne Spurr, PA 06/29/2013 9:34 AM  Signature: Harold Barban, RN 06/29/2013 9:34 AM  Signature:  Nestor Ramp, RN 06/29/2013 9:34 AM  Signature:  06/29/2013 9:34 AM  Signature:  Juline Patch, LCSW 06/29/2013 9:34 AM  Signature:  Reyes Ivan, LCSW 06/29/2013 9:34 AM  Signature:  Maseta Dorley,Care Coordinator 06/29/2013 9:34 AM  Signature:   06/29/2013 9:34 AM  Signature: 06/29/2013  9:34 AM  Signature:    Signature:      Scribe for Treatment Team:   Juline Patch,  06/29/2013 9:34 AM

## 2013-06-29 NOTE — [facility] Group Notes (Signed)
Adult Psychoeducational Group Note  Date:  06/29/2013 Time:  11:31 PM  Group Topic/Focus:  Wrap-Up Group:   The focus of this group is to help patients review their daily goal of treatment and discuss progress on daily workbooks.  Participation Level:  Active  Participation Quality:  Appropriate  Affect:  Appropriate  Cognitive:  Appropriate  Insight: Appropriate  Engagement in Group:  Engaged  Modes of Intervention:  Discussion  Additional Comments:  Anchor stated his day was pretty good overall.  He has some fear issues that he doesn't know how to address.  One coping skill he named was writing down all the things he is greatful for.  Caroll Rancher A 06/29/2013, 11:31 PM

## 2013-06-29 NOTE — Progress Notes (Signed)
Adult Psychoeducational Group Note  Date:  06/29/2013 Time:  4:41 PM  Group Topic/Focus:  Personal Choices and Values:   The focus of this group is to help patients assess and explore the importance of values in their lives, how their values affect their decisions, how they express their values and what opposes their expression.  Participation Level:  Active  Participation Quality:  Appropriate  Affect:  Appropriate  Cognitive:  Alert and Appropriate  Insight: Appropriate and Good  Engagement in Group:  Engaged  Modes of Intervention:  Discussion, Education and Support  Additional Comments:  Pt actively participated in group by completing worksheets associated with group topic. Pt verbalized that, at times, he perceives his values as standards which are too high and that he cannot attain, which leads to lowering his self-esteem. Group discussed the difference between societal standards which individuals often feel they cannot live up to and values which individuals choose for themselves as things that are important to them.  Reinaldo Raddle K 06/29/2013, 4:41 PM

## 2013-06-29 NOTE — Progress Notes (Signed)
D: Patient pleasant and cooperative with staff. Patient's affect/mood is appropriate to circumstance. He reported on the self inventory sheet that his sleep is fair, appetite is good, energy level is normal and ability to pay attention is improving. Patient rated feelings of hopelessness "5". He's attending groups and meals. Interacting with peers in the milieu. Patient compliant with medications.  A: Support and encouragement provided to patient. Administered scheduled medications per ordering MD. Monitor Q15 minute checks for safety.  R: Patient receptive. Denies SI/HI/AVH. Patient remains safe on the unit.

## 2013-06-29 NOTE — [facility] Group Notes (Signed)
[facility] LCSW Aftercare Discharge Planning Group Note   06/29/2013 12:21 PM    Participation Quality:  Appropraite  Mood/Affect:  Appropriate  Depression Rating:  6  Anxiety Rating:  6  Thoughts of Suicide:  No  Will you contract for safety?   NA  Current AVH:  No  Plan for Discharge/Comments:  Patient attending discharge planning group and actively participated in group.  He reports being better today.  Patient advised he is seen by Dr. Madaline Guthrie at [facility].  CSW provided all participants with daily workbook and information on services offered by [facility].   Transportation Means: Patient has transportation.   Supports:  Patient has a support system.   Jay Stevenson, Joesph July

## 2013-06-29 NOTE — Progress Notes (Signed)
[facility] MD Progress Note  06/29/2013 5:11 PM Jay Stevenson  MRN:  [id number]  Subjective:  The patient is up and active on the unit, he notes his depression is moderate and his anxiety is up. He is frustrated and despondent that he does not know what he is going to do with is life. Currently he is thinking of moving to [place] to work on [employer]. He denies SI/HI.  Diagnosis:   DSM5: DSM5:  Schizophrenia Disorders:  Obsessive-Compulsive Disorders:  Trauma-Stressor Disorders: Adjustment Disorder with Mixed Disturbance or Emotions and Conduct (308.03)  Substance/Addictive Disorders: Cannabis Use Disorder - Moderate 9304.30)  Depressive Disorders: Major Depressive Disorder - Severe (296.23)  AXIS I: ADHD, combined type, Anxiety Disorder NOS, Major Depression, Recurrent severe, Oppositional Defiant Disorder and Substance Abuse  AXIS II: Cluster B Traits  AXIS III:  Past Medical History   Diagnosis  Date   .  Mood disorder      anger management   .  Polysubstance abuse    .  Alcohol abuse    .  H/O: substance abuse    .  Panic attack    .  Seasonal allergies    .  Irritable bowel syndrome  06/27/2013    AXIS IV: economic problems, housing problems, occupational problems, other psychosocial or environmental problems, problems related to social environment and problems with primary support group  AXIS V: 41-50 serious symptoms   ADL's:  Intact  Sleep: Good  Appetite:  Fair  Suicidal Ideation:  denies Homicidal Ideation:  denies AEB (as evidenced by):  Psychiatric Specialty Exam: Review of Systems  Constitutional: Negative.  Negative for fever, chills, weight loss, malaise/fatigue and diaphoresis.  HENT: Negative for congestion and sore throat.   Eyes: Negative for blurred vision, double vision and photophobia.  Respiratory: Negative for cough, shortness of breath and wheezing.   Cardiovascular: Negative for chest pain, palpitations and PND.  Gastrointestinal: Negative  for heartburn, nausea, vomiting, abdominal pain, diarrhea and constipation.  Musculoskeletal: Negative for myalgias, joint pain and falls.  Neurological: Negative for dizziness, tingling, tremors, sensory change, speech change, focal weakness, seizures, loss of consciousness, weakness and headaches.  Endo/Heme/Allergies: Negative for polydipsia. Does not bruise/bleed easily.  Psychiatric/Behavioral: Negative for depression, suicidal ideas, hallucinations, memory loss and substance abuse. The patient is not nervous/anxious and does not have insomnia.     Blood pressure 120/76, pulse 66, temperature 97.3 F (36.3 C), temperature source Oral, resp. rate 20.There is no weight on file to calculate BMI.  General Appearance: Disheveled  Eye Contact::  Good  Speech:  Clear and Coherent  Volume:  Normal  Mood:  Anxious and Depressed  Affect:  Congruent  Thought Process:  Goal Directed  Orientation:  Full (Time, Place, and Person)  Thought Content:  WDL  Suicidal Thoughts:  Yes.  without intent/plan  Homicidal Thoughts:  No  Memory:  NA  Judgement:  Fair  Insight:  Fair  Psychomotor Activity:  Normal  Concentration:  Poor  Recall:  Fair  Akathisia:  No  Handed:  Right  AIMS (if indicated):     Assets:  Communication Skills Desire for Improvement  Sleep:  Number of Hours: 5.25   Current Medications: Current Facility-Administered Medications  Medication Dose Route Frequency Provider Last Rate Last Dose  . acetaminophen (TYLENOL) tablet 650 mg  650 mg Oral Q6H PRN Evanna Janann August, NP      . alum & mag hydroxide-simeth (MAALOX/MYLANTA) 200-200-20 MG/5ML suspension 30 mL  30 mL  Oral Q4H PRN Audrea Muscat, NP      . feeding supplement (ENSURE COMPLETE) liquid 237 mL  237 mL Oral TID BM Lavena Bullion, RD   237 mL at 06/29/13 1535  . fish oil-omega-3 fatty acids capsule 4 g  4 capsule Oral Daily Evanna Janann August, NP   4 g at 06/29/13 0800  . fluticasone (FLONASE) 50 MCG/ACT nasal  spray 2 spray  2 spray Each Nare Daily Evanna Janann August, NP   2 spray at 06/29/13 0744  . magnesium hydroxide (MILK OF MAGNESIA) suspension 30 mL  30 mL Oral Daily PRN Evanna Cori Burkett, NP      . polyethylene glycol (MIRALAX / GLYCOLAX) packet 17 g  17 g Oral Daily PRN Nehemiah Settle, MD   17 g at 06/28/13 1859  . thiamine (VITAMIN B-1) tablet 100 mg  100 mg Oral Daily Evanna Cori Merry Proud, NP   100 mg at 06/29/13 0742  . traZODone (DESYREL) tablet 50 mg  50 mg Oral QHS PRN,MR X 1 Evanna Janann August, NP   50 mg at 06/28/13 2253    Lab Results:  Results for orders placed during the hospital encounter of 06/27/13 (from the past 48 hour(s))  URINALYSIS, DIPSTICK ONLY     Status: None   Collection Time    06/28/13  5:35 PM      Result Value Range   Specific Gravity, Urine 1.025  1.005 - 1.030   pH 7.0  5.0 - 8.0   Glucose, UA NEGATIVE  NEGATIVE mg/dL   Hgb urine dipstick NEGATIVE  NEGATIVE   Bilirubin Urine NEGATIVE  NEGATIVE   Ketones, ur NEGATIVE  NEGATIVE mg/dL   Protein, ur NEGATIVE  NEGATIVE mg/dL   Urobilinogen, UA 0.2  0.0 - 1.0 mg/dL   Nitrite NEGATIVE  NEGATIVE   Leukocytes, UA NEGATIVE  NEGATIVE   Comment: Performed at [facility]  URINE RAPID DRUG SCREEN (HOSP PERFORMED)     Status: None   Collection Time    06/28/13  5:35 PM      Result Value Range   Opiates NONE DETECTED  NONE DETECTED   Cocaine NONE DETECTED  NONE DETECTED   Benzodiazepines NONE DETECTED  NONE DETECTED   Amphetamines NONE DETECTED  NONE DETECTED   Tetrahydrocannabinol NONE DETECTED  NONE DETECTED   Barbiturates NONE DETECTED  NONE DETECTED   Comment:            DRUG SCREEN FOR MEDICAL PURPOSES     ONLY.  IF CONFIRMATION IS NEEDED     FOR ANY PURPOSE, NOTIFY LAB     WITHIN 5 DAYS.                LOWEST DETECTABLE LIMITS     FOR URINE DRUG SCREEN     Drug Class       Cutoff (ng/mL)     Amphetamine      1000     Barbiturate      200     Benzodiazepine   200      Tricyclics       300     Opiates          300     Cocaine          300     THC              50     Performed at [facility]  CBC WITH  DIFFERENTIAL     Status: Abnormal   Collection Time    06/29/13  6:10 AM      Result Value Range   WBC 6.8  4.0 - 10.5 K/uL   RBC 4.74  4.22 - 5.81 MIL/uL   Hemoglobin 15.0  13.0 - 17.0 g/dL   HCT 16.1  09.6 - 04.5 %   MCV 88.8  78.0 - 100.0 fL   MCH 31.6  26.0 - 34.0 pg   MCHC 35.6  30.0 - 36.0 g/dL   RDW 40.9  81.1 - 91.4 %   Platelets 289  150 - 400 K/uL   Neutrophils Relative % 48  43 - 77 %   Neutro Abs 3.3  1.7 - 7.7 K/uL   Lymphocytes Relative 35  12 - 46 %   Lymphs Abs 2.4  0.7 - 4.0 K/uL   Monocytes Relative 14 (*) 3 - 12 %   Monocytes Absolute 0.9  0.1 - 1.0 K/uL   Eosinophils Relative 3  0 - 5 %   Eosinophils Absolute 0.2  0.0 - 0.7 K/uL   Basophils Relative 0  0 - 1 %   Basophils Absolute 0.0  0.0 - 0.1 K/uL   Comment: Performed at [facility]  COMPREHENSIVE METABOLIC PANEL     Status: None   Collection Time    06/29/13  6:10 AM      Result Value Range   Sodium 138  135 - 145 mEq/L   Potassium 4.0  3.5 - 5.1 mEq/L   Chloride 101  96 - 112 mEq/L   CO2 28  19 - 32 mEq/L   Glucose, Bld 88  70 - 99 mg/dL   BUN 12  6 - 23 mg/dL   Creatinine, Ser 7.82  0.50 - 1.35 mg/dL   Calcium 9.7  8.4 - 95.6 mg/dL   Total Protein 7.0  6.0 - 8.3 g/dL   Albumin 4.0  3.5 - 5.2 g/dL   AST 19  0 - 37 U/L   ALT 14  0 - 53 U/L   Alkaline Phosphatase 77  39 - 117 U/L   Total Bilirubin 1.0  0.3 - 1.2 mg/dL   GFR calc non Af Amer >90  >90 mL/min   GFR calc Af Amer >90  >90 mL/min   Comment: (NOTE)     The eGFR has been calculated using the CKD EPI equation.     This calculation has not been validated in all clinical situations.     eGFR's persistently <90 mL/min signify possible Chronic Kidney     Disease.     Performed at [facility]  TSH     Status: Abnormal   Collection Time    06/29/13   6:10 AM      Result Value Range   TSH 4.547 (*) 0.350 - 4.500 uIU/mL   Comment: Performed at [facility]  LIPID PANEL     Status: None   Collection Time    06/29/13  6:10 AM      Result Value Range   Cholesterol 161  0 - 200 mg/dL   Triglycerides 213  <086 mg/dL   HDL 45  >57 mg/dL   Total CHOL/HDL Ratio 3.6     VLDL 23  0 - 40 mg/dL   LDL Cholesterol 93  0 - 99 mg/dL   Comment:            Total Cholesterol/HDL:CHD Risk  Coronary Heart Disease Risk Table                         Men   Women      1/2 Average Risk   3.4   3.3      Average Risk       5.0   4.4      2 X Average Risk   9.6   7.1      3 X Average Risk  23.4   11.0                Use the calculated Patient Ratio     above and the CHD Risk Table     to determine the patient's CHD Risk.                ATP III CLASSIFICATION (LDL):      <100     mg/dL   Optimal      161-096  mg/dL   Near or Above                        Optimal      130-159  mg/dL   Borderline      045-409  mg/dL   High      >811     mg/dL   Very High     Performed at [facility]    Physical Findings: AIMS: Facial and Oral Movements Muscles of Facial Expression: None, normal Lips and Perioral Area: None, normal Jaw: None, normal Tongue: None, normal,Extremity Movements Upper (arms, wrists, hands, fingers): None, normal Lower (legs, knees, ankles, toes): None, normal, Trunk Movements Neck, shoulders, hips: None, normal, Overall Severity Severity of abnormal movements (highest score from questions above): None, normal Incapacitation due to abnormal movements: None, normal Patient's awareness of abnormal movements (rate only patient's report): No Awareness, Dental Status Current problems with teeth and/or dentures?: No Does patient usually wear dentures?: No  CIWA:  CIWA-Ar Total: 1 COWS:  COWS Total Score: 1  Treatment Plan Summary: Daily contact with patient to assess and evaluate symptoms and progress in  treatment Medication management  Plan: 1. Continue crisis management and stabilization. 2. Medication management to reduce current symptoms to base line and improve patient's overall level of functioning 3. Treat health problems as indicated. 4. Develop treatment plan to decrease risk of relapse upon discharge and the need for readmission. 5. Psycho-social education regarding relapse prevention and self care. 6. Health care follow up as needed for medical problems. 7. Continue home medications where appropriate. 8. ELOS: 3 days   Medical Decision Making Problem Points:  Established problem, stable/improving (1) Data Points:  Review or order medicine tests (1)  I certify that inpatient services furnished can reasonably be expected to improve the patient's condition.   MASHBURN,NEIL 06/29/2013, 5:11 PM  Reviewed the information documented and agree with the treatment plan.  Pedro Oldenburg,JANARDHAHA R. 06/30/2013 2:02 PM

## 2013-06-29 NOTE — Progress Notes (Signed)
Recreation Therapy Notes  Date: 10.01.2014 Time: 3:00pm Location: [address] Dayroom  Group Topic: Coping Skills  Goal Area(s) Addresses:  Patient will identify aspects of their life they are grateful for.  Patient will verbalize importance of recognizing things to be grateful for.  Behavioral Response: Appropriate  Intervention: Informational Worksheet  Activity: Grateful Wheel. Patient provided a worksheet with various categories of things to be grateful for (nature, work, life, play, health, this moment). Patient was asked to identify at least two things they are personally grateful for to fit in each category.   Education: Discharge Planning, Coping Skills   Education Outcome: Acknowledges understanding  Clinical Observations/Feedback: Patient completed worksheet as requested. Patient made no contributions to group discussion.  Maleigha Colvard L Yoshi Vicencio, LRT/CTRS  Gao Mitnick L 06/29/2013 4:53 PM 

## 2013-06-29 NOTE — [facility] Group Notes (Signed)
[facility] LCSW Group Therapy  Emotional Regulation 1:15 - 2: 30 PM        06/29/2013  3:29 PM   Type of Therapy:  Group Therapy  Participation Level:  Appropriate  Participation Quality:  Appropriate  Affect:  Appropriate  Cognitive:  Attentive Appropriate  Insight:  Developing/Improving  Engagement in Therapy:  Developing/Improving  Modes of Intervention:  Discussion Exploration Problem-Solving Supportive  Summary of Progress/Problems:  Group topic was emotional regulations.  Patient participated nodded in agreement with peers but did not engage in discussion.  Wynn Banker 06/29/2013 3:29 PM

## 2013-06-30 MED ORDER — FLUOXETINE HCL 10 MG PO CAPS
10.0000 mg | ORAL_CAPSULE | Freq: Every day | ORAL | Status: DC
  Filled 2013-06-30: qty 14
  Filled 2013-06-30 (×3): qty 1

## 2013-06-30 MED ORDER — TAB-A-VITE/IRON PO TABS
1.0000 | ORAL_TABLET | Freq: Every day | ORAL | Status: DC
  Filled 2013-06-30 (×4): qty 1

## 2013-06-30 NOTE — Progress Notes (Signed)
Recreation Therapy Notes  Date: 10.02.2014 Time: 2:45pom Location: [address] Dayroom  Group Topic: Animal Assisted Activities (AAA)  Behavioral Response: Engaged, Attentive, Appropriate  Affect: Euthymic  Clinical Observations/Feedback: Dog Team: Slate & handler. Patient interacted appropriately with peer, dog team, LRT and MHT.   Molleigh Huot L Lizabeth Fellner, LRT/CTRS  Jonmichael Beadnell L 06/30/2013 5:27 PM 

## 2013-06-30 NOTE — Progress Notes (Signed)
Adult Psychoeducational Group Note  Date:  06/30/2013 Time:  10:00am Group Topic/Focus:  Therapeutic activity-Boundaries  Participation Level:  Minimal  Participation Quality:  Appropriate and Attentive  Affect:  Appropriate  Cognitive:  Alert and Appropriate  Insight: Appropriate  Engagement in Group:  Engaged  Modes of Intervention:  Discussion and Education  Additional Comments:  Pt attended and participated in group. Pt stated boundaries meant lines you should not cross. Shelly Bombard D 06/30/2013, 11:58 AM

## 2013-06-30 NOTE — Progress Notes (Signed)
D: Patient's affect and mood is appropriate to circumstance. He reported on the self inventory sheet that his sleep is fair, appetite is good, energy level is normal and ability to pay attention is improving. Tolerated medications well. Participating in groups.  A: Support and encouragement provided to patient. Scheduled medications administered per MD orders. Maintain Q15 minute checks for safety.  R: Patient receptive. Denies SI/HI/AVH. Patient remains safe.

## 2013-06-30 NOTE — Progress Notes (Signed)
[facility] MD Progress Note  06/30/2013 2:55 PM Jaki Hammerschmidt  MRN:  [id number]  Subjective:  Patient is seen and chart reviewed. Patient has been compliant with her medication without adverse effects. Patient stated "I am doing fine, thank you" patient notes his depression is moderate and his anxiety increase. He is frustrated and despondent that he does not know what he is going to do with is life after the inpatient hospitalization. Patient rated his depression as a 2/10 anxiety 3 or 4/10. Patient is hoping he will get a case management and outpatient treatment planned before he will be discharged.  Diagnosis:   DSM5: DSM5:  Schizophrenia Disorders:  Obsessive-Compulsive Disorders:  Trauma-Stressor Disorders: Adjustment Disorder with Mixed Disturbance or Emotions and Conduct (308.03)  Substance/Addictive Disorders: Cannabis Use Disorder - Moderate 9304.30)  Depressive Disorders: Major Depressive Disorder - Severe (296.23)   AXIS I: ADHD, combined type, Anxiety Disorder NOS, Major Depression, Recurrent severe, Oppositional Defiant Disorder and Substance Abuse  AXIS II: Cluster B Traits  AXIS III:  Past Medical History   Diagnosis  Date   .  Mood disorder      anger management   .  Polysubstance abuse    .  Alcohol abuse    .  H/O: substance abuse    .  Panic attack    .  Seasonal allergies    .  Irritable bowel syndrome  06/27/2013    AXIS IV: economic problems, housing problems, occupational problems, other psychosocial or environmental problems, problems related to social environment and problems with primary support group  AXIS V: 41-50 serious symptoms   ADL's:  Intact  Sleep: Good  Appetite:  Fair  Suicidal Ideation:  denies Homicidal Ideation:  denies AEB (as evidenced by):  Psychiatric Specialty Exam: Review of Systems  Constitutional: Negative.  Negative for fever, chills, weight loss, malaise/fatigue and diaphoresis.  HENT: Negative for congestion and sore throat.    Eyes: Negative for blurred vision, double vision and photophobia.  Respiratory: Negative for cough, shortness of breath and wheezing.   Cardiovascular: Negative for chest pain, palpitations and PND.  Gastrointestinal: Negative for heartburn, nausea, vomiting, abdominal pain, diarrhea and constipation.  Musculoskeletal: Negative for myalgias, joint pain and falls.  Neurological: Negative for dizziness, tingling, tremors, sensory change, speech change, focal weakness, seizures, loss of consciousness, weakness and headaches.  Endo/Heme/Allergies: Negative for polydipsia. Does not bruise/bleed easily.  Psychiatric/Behavioral: Negative for depression, suicidal ideas, hallucinations, memory loss and substance abuse. The patient is not nervous/anxious and does not have insomnia.     Blood pressure 136/85, pulse 63, temperature 97.4 F (36.3 C), temperature source Oral, resp. rate 16.There is no weight on file to calculate BMI.  General Appearance: Disheveled  Eye Contact::  Good  Speech:  Clear and Coherent  Volume:  Normal  Mood:  Anxious and Depressed  Affect:  Congruent  Thought Process:  Goal Directed  Orientation:  Full (Time, Place, and Person)  Thought Content:  WDL  Suicidal Thoughts:  Yes.  without intent/plan  Homicidal Thoughts:  No  Memory:  NA  Judgement:  Fair  Insight:  Fair  Psychomotor Activity:  Normal  Concentration:  Poor  Recall:  Fair  Akathisia:  No  Handed:  Right  AIMS (if indicated):     Assets:  Communication Skills Desire for Improvement  Sleep:  Number of Hours: 5.25   Current Medications: Current Facility-Administered Medications  Medication Dose Route Frequency Provider Last Rate Last Dose  . acetaminophen (TYLENOL) tablet 650  mg  650 mg Oral Q6H PRN Audrea Muscat, NP      . alum & mag hydroxide-simeth (MAALOX/MYLANTA) 200-200-20 MG/5ML suspension 30 mL  30 mL Oral Q4H PRN Evanna Janann August, NP      . feeding supplement (ENSURE COMPLETE)  liquid 237 mL  237 mL Oral TID BM Lavena Bullion, RD   237 mL at 06/30/13 1448  . fish oil-omega-3 fatty acids capsule 4 g  4 capsule Oral Daily Evanna Janann August, NP   4 g at 06/30/13 0800  . fluticasone (FLONASE) 50 MCG/ACT nasal spray 2 spray  2 spray Each Nare Daily Evanna Janann August, NP   2 spray at 06/30/13 0742  . magnesium hydroxide (MILK OF MAGNESIA) suspension 30 mL  30 mL Oral Daily PRN Evanna Cori Burkett, NP      . polyethylene glycol (MIRALAX / GLYCOLAX) packet 17 g  17 g Oral Daily PRN Nehemiah Settle, MD   17 g at 06/29/13 2236  . thiamine (VITAMIN B-1) tablet 100 mg  100 mg Oral Daily Evanna Cori Merry Proud, NP   100 mg at 06/30/13 0741  . traZODone (DESYREL) tablet 50 mg  50 mg Oral QHS PRN,MR X 1 Evanna Janann August, NP   50 mg at 06/29/13 2239    Lab Results:  Results for orders placed during the hospital encounter of 06/27/13 (from the past 48 hour(s))  URINALYSIS, DIPSTICK ONLY     Status: None   Collection Time    06/28/13  5:35 PM      Result Value Range   Specific Gravity, Urine 1.025  1.005 - 1.030   pH 7.0  5.0 - 8.0   Glucose, UA NEGATIVE  NEGATIVE mg/dL   Hgb urine dipstick NEGATIVE  NEGATIVE   Bilirubin Urine NEGATIVE  NEGATIVE   Ketones, ur NEGATIVE  NEGATIVE mg/dL   Protein, ur NEGATIVE  NEGATIVE mg/dL   Urobilinogen, UA 0.2  0.0 - 1.0 mg/dL   Nitrite NEGATIVE  NEGATIVE   Leukocytes, UA NEGATIVE  NEGATIVE   Comment: Performed at [facility]  URINE RAPID DRUG SCREEN (HOSP PERFORMED)     Status: None   Collection Time    06/28/13  5:35 PM      Result Value Range   Opiates NONE DETECTED  NONE DETECTED   Cocaine NONE DETECTED  NONE DETECTED   Benzodiazepines NONE DETECTED  NONE DETECTED   Amphetamines NONE DETECTED  NONE DETECTED   Tetrahydrocannabinol NONE DETECTED  NONE DETECTED   Barbiturates NONE DETECTED  NONE DETECTED   Comment:            DRUG SCREEN FOR MEDICAL PURPOSES     ONLY.  IF CONFIRMATION IS NEEDED      FOR ANY PURPOSE, NOTIFY LAB     WITHIN 5 DAYS.                LOWEST DETECTABLE LIMITS     FOR URINE DRUG SCREEN     Drug Class       Cutoff (ng/mL)     Amphetamine      1000     Barbiturate      200     Benzodiazepine   200     Tricyclics       300     Opiates          300     Cocaine          300  THC              50     Performed at [facility]  CBC WITH DIFFERENTIAL     Status: Abnormal   Collection Time    06/29/13  6:10 AM      Result Value Range   WBC 6.8  4.0 - 10.5 K/uL   RBC 4.74  4.22 - 5.81 MIL/uL   Hemoglobin 15.0  13.0 - 17.0 g/dL   HCT 96.0  45.4 - 09.8 %   MCV 88.8  78.0 - 100.0 fL   MCH 31.6  26.0 - 34.0 pg   MCHC 35.6  30.0 - 36.0 g/dL   RDW 11.9  14.7 - 82.9 %   Platelets 289  150 - 400 K/uL   Neutrophils Relative % 48  43 - 77 %   Neutro Abs 3.3  1.7 - 7.7 K/uL   Lymphocytes Relative 35  12 - 46 %   Lymphs Abs 2.4  0.7 - 4.0 K/uL   Monocytes Relative 14 (*) 3 - 12 %   Monocytes Absolute 0.9  0.1 - 1.0 K/uL   Eosinophils Relative 3  0 - 5 %   Eosinophils Absolute 0.2  0.0 - 0.7 K/uL   Basophils Relative 0  0 - 1 %   Basophils Absolute 0.0  0.0 - 0.1 K/uL   Comment: Performed at [facility]  COMPREHENSIVE METABOLIC PANEL     Status: None   Collection Time    06/29/13  6:10 AM      Result Value Range   Sodium 138  135 - 145 mEq/L   Potassium 4.0  3.5 - 5.1 mEq/L   Chloride 101  96 - 112 mEq/L   CO2 28  19 - 32 mEq/L   Glucose, Bld 88  70 - 99 mg/dL   BUN 12  6 - 23 mg/dL   Creatinine, Ser 5.62  0.50 - 1.35 mg/dL   Calcium 9.7  8.4 - 13.0 mg/dL   Total Protein 7.0  6.0 - 8.3 g/dL   Albumin 4.0  3.5 - 5.2 g/dL   AST 19  0 - 37 U/L   ALT 14  0 - 53 U/L   Alkaline Phosphatase 77  39 - 117 U/L   Total Bilirubin 1.0  0.3 - 1.2 mg/dL   GFR calc non Af Amer >90  >90 mL/min   GFR calc Af Amer >90  >90 mL/min   Comment: (NOTE)     The eGFR has been calculated using the CKD EPI equation.     This calculation  has not been validated in all clinical situations.     eGFR's persistently <90 mL/min signify possible Chronic Kidney     Disease.     Performed at [facility]  TSH     Status: Abnormal   Collection Time    06/29/13  6:10 AM      Result Value Range   TSH 4.547 (*) 0.350 - 4.500 uIU/mL   Comment: Performed at [facility]  LIPID PANEL     Status: None   Collection Time    06/29/13  6:10 AM      Result Value Range   Cholesterol 161  0 - 200 mg/dL   Triglycerides 865  <784 mg/dL   HDL 45  >69 mg/dL   Total CHOL/HDL Ratio 3.6     VLDL 23  0 - 40 mg/dL  LDL Cholesterol 93  0 - 99 mg/dL   Comment:            Total Cholesterol/HDL:CHD Risk     Coronary Heart Disease Risk Table                         Men   Women      1/2 Average Risk   3.4   3.3      Average Risk       5.0   4.4      2 X Average Risk   9.6   7.1      3 X Average Risk  23.4   11.0                Use the calculated Patient Ratio     above and the CHD Risk Table     to determine the patient's CHD Risk.                ATP III CLASSIFICATION (LDL):      <100     mg/dL   Optimal      161-096  mg/dL   Near or Above                        Optimal      130-159  mg/dL   Borderline      045-409  mg/dL   High      >811     mg/dL   Very High     Performed at [facility]    Physical Findings: AIMS: Facial and Oral Movements Muscles of Facial Expression: None, normal Lips and Perioral Area: None, normal Jaw: None, normal Tongue: None, normal,Extremity Movements Upper (arms, wrists, hands, fingers): None, normal Lower (legs, knees, ankles, toes): None, normal, Trunk Movements Neck, shoulders, hips: None, normal, Overall Severity Severity of abnormal movements (highest score from questions above): None, normal Incapacitation due to abnormal movements: None, normal Patient's awareness of abnormal movements (rate only patient's report): No Awareness, Dental Status Current problems with  teeth and/or dentures?: No Does patient usually wear dentures?: No  CIWA:  CIWA-Ar Total: 1 COWS:  COWS Total Score: 1  Treatment Plan Summary: Daily contact with patient to assess and evaluate symptoms and progress in treatment Medication management  Plan: 1. Continue crisis management and stabilization. 2. Medication management to reduce current symptoms to base line and improve patient's overall level of functioning; start fluoxetine 20 mg daily and continue current treatment; 3. Treat health problems as indicated. 4. Develop treatment plan to decrease risk of relapse upon discharge and the need for readmission. 5. Psycho-social education regarding relapse prevention and self care. 6. Health care follow up as needed for medical problems. 7. Continue home medications where appropriate. 8. ELOS: 3 days   Medical Decision Making Problem Points:  Established problem, stable/improving (1) Data Points:  Review or order medicine tests (1)  I certify that inpatient services furnished can reasonably be expected to improve the patient's condition.   Tymar Polyak,JANARDHAHA R. 06/30/2013, 2:55 PM

## 2013-06-30 NOTE — Progress Notes (Addendum)
D: Pt is appropriate in affect and mood with some anxiety. Pt attended group this evening. Pt observed interacting appropriately within the milieu. Pt is reporting the groups as beneficial as he discovers positive coping skills for his anxiety. A: Writer administered scheduled and prn medications to pt. Continued support and availability as needed was extended to this pt. Staff continue to monitor pt with q28min checks.  R: No adverse drug reactions noted. Pt receptive to treatment. Pt remains safe at this time.   Pt is proud to be sober from [place] and other drug use. Pt congratulated on sobriety.

## 2013-06-30 NOTE — [facility] Group Notes (Signed)
[facility] LCSW Group Therapy  [facility] 1:15 - 2:30 PM  06/30/2013   Type of Therapy:  Group Therapy  Participation Level:  Minimal  Participation Quality:  Attentive  Affect:  Appropriate  Cognitive:  Appropriate  Insight:  Developing/Improving   Engagement in Therapy:  Developing/Improving  Modes of Intervention:  Discussion, Education, Exploration, Problem-Solving, Rapport Building, Support   Summary of Progress/Problems:  Patient listened attentively to speaker from [employer]. Patient nodded in agreements to comments but did not engage in discussion following the presentation.  Wynn Banker 06/30/2013

## 2013-06-30 NOTE — Progress Notes (Signed)
Adult Psychoeducational Group Note  Date:  06/30/2013 Time:  3:52 PM  Group Topic/Focus:  Building Self Esteem:   The Focus of this group is helping patients become aware of the effects of self-esteem on their lives, the things they and others do that enhance or undermine their self-esteem, seeing the relationship between their level of self-esteem and the choices they make and learning ways to enhance self-esteem.  Participation Level:  Active  Participation Quality:  Appropriate and Attentive  Affect:  Defensive and Irritable  Cognitive:  Alert  Insight: None  Engagement in Group:  Engaged  Modes of Intervention:  Activity, Discussion, Exploration, Socialization and Support  Additional Comments:  Pt came to group and when asked to share pt said he "did not have anything to say" because he was "frustrated". Pt would not elaborate further in group and this writer followed up with the pt after group and pt felt like in some groups not every pt has the opportunity to speak and share.   Cathlean Cower 06/30/2013, 3:52 PM

## 2013-07-01 MED ORDER — FLUOXETINE HCL 10 MG PO CAPS: 10.0000 mg | ORAL_CAPSULE | Freq: Every day | ORAL | Status: DC

## 2013-07-01 MED ORDER — VITAMIN D 50 MCG (2000 UT) PO TABS: 8000.0000 [IU] | ORAL_TABLET | Freq: Every day | ORAL | Status: DC

## 2013-07-01 MED ORDER — TRAZODONE HCL 50 MG PO TABS: 50.0000 mg | ORAL_TABLET | Freq: Every day | ORAL | Status: DC

## 2013-07-01 MED ORDER — OMEGA-3-ACID ETHYL ESTERS 1 G PO CAPS: 4.0000 g | ORAL_CAPSULE | Freq: Every day | ORAL | Status: DC

## 2013-07-01 MED ORDER — TRAZODONE HCL 50 MG PO TABS
50.0000 mg | ORAL_TABLET | Freq: Every day | ORAL | Status: DC
  Filled 2013-07-01: qty 4

## 2013-07-01 NOTE — Progress Notes (Signed)
D: Patient's affect/mood is appropriate to situation. Continues to adhere to the current medication regimen. Interactive with peers in the milieu. Attending groups and meals. Patient is to d/c today. He's been informed about discharge instructions and prescriptions.  A: Support and encouragement provided to patient. Scheduled medications administered per ordering MD. Maintain Q15 minute checks for safety.  R: Patient receptive. Denies SI/HI. Patient remains safe on the unit.

## 2013-07-01 NOTE — Tx Team (Signed)
Interdisciplinary Treatment Plan Update   Date Reviewed:  07/01/2013  Time Reviewed:  9:36 AM  Progress in Treatment:   Attending groups: Yes Participating in groups: Yes Taking medication as prescribed: Yes  Tolerating medication: Yes Family/Significant other contact made: Yes, contact made with father Patient understands diagnosis: Yes  Discussing patient identified problems/goals with staff: Yes Medical problems stabilized or resolved: Yes Denies suicidal/homicidal ideation: Yes Patient has not harmed self or others: Yes  For review of initial/current patient goals, please see plan of care.  Estimated Length of Stay:  Discharge today  Reasons for Continued Hospitalization:    New Problems/Goals identified:    Discharge Plan or Barriers:   Home with outpatient follow up with [facility].  Additional Comments:   N/A   Attendees:  Patient: Jay Stevenson 07/01/2013 9:36 AM   Signature: Mervyn Gay, MD 07/01/2013 9:36 AM  Signature:  Verne Spurr, PA 07/01/2013 9:36 AM  Signature: Harold Barban, RN 07/01/2013 9:36 AM  Signature:   07/01/2013 9:36 AM  Signature:  07/01/2013 9:36 AM  Signature:  Juline Patch, LCSW 07/01/2013 9:36 AM  Signature:  Reyes Ivan, LCSW 07/01/2013 9:36 AM  Signature:  Maseta Dorley,Care Coordinator 07/01/2013 9:36 AM  Signature:   07/01/2013 9:36 AM  Signature: 07/01/2013  9:36 AM  Signature:    Signature:      Scribe for Treatment Team:   Juline Patch,  07/01/2013 9:36 AM

## 2013-07-01 NOTE — Discharge Summary (Signed)
Physician Discharge Summary Note  Patient:  Jay Stevenson is an 22 y.o., male MRN:  [id number] DOB:  May 27, 1991 Patient phone:  [phone] (home)  Patient address:   [address] [place] [address],   Date of Admission:  06/27/2013 Date of Discharge:  07/01/2013   Reason for Admission:  Depression with suicidal ideation and a plan.  Discharge Diagnoses: Active Problems:   Mood disorder   Polysubstance abuse   Constipation - functional   Anxiety  ROS  DSM5: Schizophrenia Disorders:  Obsessive-Compulsive Disorders:  Trauma-Stressor Disorders: Adjustment Disorder with Mixed Disturbance or Emotions and Conduct (308.03)  Substance/Addictive Disorders: Cannabis Use Disorder - Moderate 9304.30)  Depressive Disorders: Major Depressive Disorder - Severe (296.23)  AXIS I: ADHD, combined type, Anxiety Disorder NOS, Major Depression, Recurrent severe, Oppositional Defiant Disorder and Substance Abuse  AXIS II: Cluster B Traits  AXIS III:  Past Medical History   Diagnosis  Date   .  Mood disorder      anger management   .  Polysubstance abuse    .  Alcohol abuse    .  H/O: substance abuse    .  Panic attack    .  Seasonal allergies    .  Irritable bowel syndrome  06/27/2013    AXIS IV: economic problems, housing problems, occupational problems, other psychosocial or environmental problems, problems related to social environment and problems with primary support group  AXIS V: 41-50 serious symptoms     Level of Care:  OP  Hospital Course:       [name] is a 22 y.o. single white male admitted voluntarily from [facility] assessment office for depression and suicidal ideations. He reports that "I am on the verge of suicide, wit nowhere to turn. I feel like I'm about to lose it. I've lost my job, had to drop school and I feel like a ticking time bomb. I have nowhere to fall so I'm here."        [name] was oriented to the unit and encouraged to participate in unit  programming. Medical problems were identified and treated appropriately. Home medication was restarted as needed. Psychiatric medication management was initiated.        The patient was evaluated each day by a clinical provider to ascertain the patient's response to treatment.  Improvement was noted by the patient's report of decreasing symptoms, improved sleep and appetite, affect, medication tolerance, behavior, and participation in unit programming.  Sparrow was asked each day to complete a self inventory noting mood, mental status, pain, new symptoms, anxiety and concerns.         He responded well to medication and being in a therapeutic and supportive environment. Positive and appropriate behavior was noted and the patient was motivated for recovery.  [name] worked closely with the treatment team and case manager to develop a discharge plan with appropriate goals. Coping skills, problem solving as well as relaxation therapies were also part of the unit programming.         By the day of discharge Kirklin was in much improved condition than upon admission.  Symptoms were reported as significantly decreased or resolved completely.  The patient denied SI/HI and voiced no AVH. He was motivated to continue taking medication with a goal of continued improvement in mental health.          [name] was discharged home with a plan to follow up as noted below.  Consults:  None  Significant Diagnostic Studies:  None  Discharge Vitals:   Blood pressure 126/76, pulse 77, temperature 97.8 F (36.6 C), temperature source Oral, resp. rate 16. There is no weight on file to calculate BMI. Lab Results:   Results for orders placed during the hospital encounter of 06/27/13 (from the past 72 hour(s))  URINALYSIS, DIPSTICK ONLY     Status: None   Collection Time    06/28/13  5:35 PM      Result Value Range   Specific Gravity, Urine 1.025  1.005 - 1.030   pH 7.0  5.0 - 8.0   Glucose, UA NEGATIVE  NEGATIVE  mg/dL   Hgb urine dipstick NEGATIVE  NEGATIVE   Bilirubin Urine NEGATIVE  NEGATIVE   Ketones, ur NEGATIVE  NEGATIVE mg/dL   Protein, ur NEGATIVE  NEGATIVE mg/dL   Urobilinogen, UA 0.2  0.0 - 1.0 mg/dL   Nitrite NEGATIVE  NEGATIVE   Leukocytes, UA NEGATIVE  NEGATIVE   Comment: Performed at [facility]  URINE RAPID DRUG SCREEN (HOSP PERFORMED)     Status: None   Collection Time    06/28/13  5:35 PM      Result Value Range   Opiates NONE DETECTED  NONE DETECTED   Cocaine NONE DETECTED  NONE DETECTED   Benzodiazepines NONE DETECTED  NONE DETECTED   Amphetamines NONE DETECTED  NONE DETECTED   Tetrahydrocannabinol NONE DETECTED  NONE DETECTED   Barbiturates NONE DETECTED  NONE DETECTED   Comment:            DRUG SCREEN FOR MEDICAL PURPOSES     ONLY.  IF CONFIRMATION IS NEEDED     FOR ANY PURPOSE, NOTIFY LAB     WITHIN 5 DAYS.                LOWEST DETECTABLE LIMITS     FOR URINE DRUG SCREEN     Drug Class       Cutoff (ng/mL)     Amphetamine      1000     Barbiturate      200     Benzodiazepine   200     Tricyclics       300     Opiates          300     Cocaine          300     THC              50     Performed at [facility]  CBC WITH DIFFERENTIAL     Status: Abnormal   Collection Time    06/29/13  6:10 AM      Result Value Range   WBC 6.8  4.0 - 10.5 K/uL   RBC 4.74  4.22 - 5.81 MIL/uL   Hemoglobin 15.0  13.0 - 17.0 g/dL   HCT 40.9  81.1 - 91.4 %   MCV 88.8  78.0 - 100.0 fL   MCH 31.6  26.0 - 34.0 pg   MCHC 35.6  30.0 - 36.0 g/dL   RDW 78.2  95.6 - 21.3 %   Platelets 289  150 - 400 K/uL   Neutrophils Relative % 48  43 - 77 %   Neutro Abs 3.3  1.7 - 7.7 K/uL   Lymphocytes Relative 35  12 - 46 %   Lymphs Abs 2.4  0.7 - 4.0 K/uL   Monocytes Relative 14 (*) 3 - 12 %   Monocytes Absolute  0.9  0.1 - 1.0 K/uL   Eosinophils Relative 3  0 - 5 %   Eosinophils Absolute 0.2  0.0 - 0.7 K/uL   Basophils Relative 0  0 - 1 %   Basophils  Absolute 0.0  0.0 - 0.1 K/uL   Comment: Performed at [facility]  COMPREHENSIVE METABOLIC PANEL     Status: None   Collection Time    06/29/13  6:10 AM      Result Value Range   Sodium 138  135 - 145 mEq/L   Potassium 4.0  3.5 - 5.1 mEq/L   Chloride 101  96 - 112 mEq/L   CO2 28  19 - 32 mEq/L   Glucose, Bld 88  70 - 99 mg/dL   BUN 12  6 - 23 mg/dL   Creatinine, Ser 1.61  0.50 - 1.35 mg/dL   Calcium 9.7  8.4 - 09.6 mg/dL   Total Protein 7.0  6.0 - 8.3 g/dL   Albumin 4.0  3.5 - 5.2 g/dL   AST 19  0 - 37 U/L   ALT 14  0 - 53 U/L   Alkaline Phosphatase 77  39 - 117 U/L   Total Bilirubin 1.0  0.3 - 1.2 mg/dL   GFR calc non Af Amer >90  >90 mL/min   GFR calc Af Amer >90  >90 mL/min   Comment: (NOTE)     The eGFR has been calculated using the CKD EPI equation.     This calculation has not been validated in all clinical situations.     eGFR's persistently <90 mL/min signify possible Chronic Kidney     Disease.     Performed at [facility]  TSH     Status: Abnormal   Collection Time    06/29/13  6:10 AM      Result Value Range   TSH 4.547 (*) 0.350 - 4.500 uIU/mL   Comment: Performed at [facility]  LIPID PANEL     Status: None   Collection Time    06/29/13  6:10 AM      Result Value Range   Cholesterol 161  0 - 200 mg/dL   Triglycerides 045  <409 mg/dL   HDL 45  >81 mg/dL   Total CHOL/HDL Ratio 3.6     VLDL 23  0 - 40 mg/dL   LDL Cholesterol 93  0 - 99 mg/dL   Comment:            Total Cholesterol/HDL:CHD Risk     Coronary Heart Disease Risk Table                         Men   Women      1/2 Average Risk   3.4   3.3      Average Risk       5.0   4.4      2 X Average Risk   9.6   7.1      3 X Average Risk  23.4   11.0                Use the calculated Patient Ratio     above and the CHD Risk Table     to determine the patient's CHD Risk.                ATP III CLASSIFICATION (LDL):      <100  mg/dL   Optimal       161-096  mg/dL   Near or Above                        Optimal      130-159  mg/dL   Borderline      045-409  mg/dL   High      >811     mg/dL   Very High     Performed at [facility]    Physical Findings: AIMS: Facial and Oral Movements Muscles of Facial Expression: None, normal Lips and Perioral Area: None, normal Jaw: None, normal Tongue: None, normal,Extremity Movements Upper (arms, wrists, hands, fingers): None, normal Lower (legs, knees, ankles, toes): None, normal, Trunk Movements Neck, shoulders, hips: None, normal, Overall Severity Severity of abnormal movements (highest score from questions above): None, normal Incapacitation due to abnormal movements: None, normal Patient's awareness of abnormal movements (rate only patient's report): No Awareness, Dental Status Current problems with teeth and/or dentures?: No Does patient usually wear dentures?: No  CIWA:  CIWA-Ar Total: 1 COWS:  COWS Total Score: 1  Psychiatric Specialty Exam: See Psychiatric Specialty Exam and Suicide Risk Assessment completed by Attending Physician prior to discharge.  Discharge destination:  Home  Is patient on multiple antipsychotic therapies at discharge:  No   Has Patient had three or more failed trials of antipsychotic monotherapy by history:  No  Recommended Plan for Multiple Antipsychotic Therapies: NA  Discharge Orders   Future Appointments Provider Department Dept Phone   08/15/2013 3:00 PM Waymon Budge, MD [facility] [phone]   Future Orders Complete By Expires   Diet - low sodium heart healthy  As directed    Discharge instructions  As directed    Comments:     Take all of your medications as directed. Be sure to keep all of your follow up appointments.  If you are unable to keep your follow up appointment, call your Doctor's office to let them know, and reschedule.  Make sure that you have enough medication to last until your appointment. Be sure to get  plenty of rest. Going to bed at the same time each night will help. Try to avoid sleeping during the day.  Increase your activity as tolerated. Regular exercise will help you to sleep better and improve your mental health. Eating a heart healthy diet is recommended. Try to avoid salty or fried foods. Be sure to avoid all alcohol and illegal drugs.   Increase activity slowly  As directed        Medication List    STOP taking these medications       DOXYCYCLINE HYCLATE PO     polyethylene glycol packet  Commonly known as:  MIRALAX / GLYCOLAX     thiamine 100 MG tablet  Commonly known as:  VITAMIN B-1     Zinc 15 MG Caps      TAKE these medications     Indication   FLUoxetine 10 MG capsule  Commonly known as:  PROZAC  Take 1 capsule (10 mg total) by mouth daily. For depression.   Indication:  Depression     omega-3 acid ethyl esters 1 G capsule  Commonly known as:  LOVAZA  Take 4 capsules (4 g total) by mouth daily. For hyperlipidemia.   Indication:  High Amount of Triglycerides in the Blood     traZODone 50 MG tablet  Commonly known as:  DESYREL  Take 1 tablet (50 mg total) by mouth at bedtime. For insomnia.   Indication:  Trouble Sleeping     Vitamin D 2000 UNITS tablet  Take 4 tablets (8,000 Units total) by mouth daily. For Vitamin D deficiency.    Vitamin D Deficiency         Follow-up Information   Follow up with Dr. Madaline Guthrie - [facility] On 08/04/2013. (Thursday, August 04, 2013 at 4:00 PM)    Contact information:   [address] [place], [place]   [address]   [phone]      Follow up with [facility].   Contact information:   [phone]      Follow-up recommendations:   Activities: Resume activity as tolerated. Diet: Heart healthy low sodium diet Tests: Follow up testing will be determined by your out patient provider. Comments:    Total Discharge Time:  Less than 30 minutes.  Signed:  Rona Ravens. Mashburn  RPAC 10:54 AM 07/01/2013  Patient seen and evaluated for discharge suicidal risk assessment and case discussed with treatment team and developed plan. Reviewed the information documented and agree with the treatment plan.  Annali Lybrand,JANARDHAHA R. 07/01/2013 6:54 PM

## 2013-07-01 NOTE — BHH Group Notes (Signed)
[facility] LCSW Aftercare Discharge Planning Group Note   07/01/2013 10:44 AM    Participation Quality:  Appropraite  Mood/Affect:  Appropriate  Depression Rating:  1  Anxiety Rating:  1  Thoughts of Suicide:  No  Will you contract for safety?   NA  Current AVH:  No  Plan for Discharge/Comments:  Patient attending discharge planning group and actively participated in group.  He will follow up with [facility].  CSW provided all participants with daily workbook and information on services offered by [facility].   Transportation Means: Patient has transportation.   Supports:  Patient has a support system.   Adanna Zuckerman, Joesph July

## 2013-07-01 NOTE — Tx Team (Deleted)
Interdisciplinary Treatment Plan Update   Date Reviewed:  07/01/2013  Time Reviewed:  10:42 AM  Progress in Treatment:   Attending groups: Yes Participating in groups: Yes Taking medication as prescribed: Yes  Tolerating medication: Yes Family/Significant other contact made: Yes, contact made with father Patient understands diagnosis: Yes  Discussing patient identified problems/goals with staff: Yes Medical problems stabilized or resolved: Yes Denies suicidal/homicidal ideation: Yes Patient has not harmed self or others: Yes  For review of initial/current patient goals, please see plan of care.  Estimated Length of Stay: Discharge today.  Reasons for Continued Hospitalization:  Anxiety Depression Medication stabilization   New Problems/Goals identified:    Discharge Plan or Barriers:   Home with outpatient follow up with [facility].  Additional Comments:  N/A  Attendees:  Patient:  Jay Stevenson 07/01/2013 10:42 AM   Signature: Mervyn Gay, MD 07/01/2013 10:42 AM  Signature:  Verne Spurr, PA 07/01/2013 10:42 AM  Signature: Harold Barban, RN 07/01/2013 10:42 AM  Signature:   07/01/2013 10:42 AM  Signature:  07/01/2013 10:42 AM  Signature:  Juline Patch, LCSW 07/01/2013 10:42 AM  Signature:  Reyes Ivan, LCSW 07/01/2013 10:42 AM  Signature:  Maseta Dorley,Care Coordinator 07/01/2013 10:42 AM  Signature:   07/01/2013 10:42 AM  Signature: 07/01/2013  10:42 AM  Signature:    Signature:      Scribe for Treatment Team:   Juline Patch,  07/01/2013 10:42 AM

## 2013-07-01 NOTE — BHH Suicide Risk Assessment (Signed)
Suicide Risk Assessment  Discharge Assessment     Demographic Factors:  Male, Adolescent or young adult, Caucasian and Low socioeconomic status  Mental Status Per Nursing Assessment::   On Admission:  Self-harm thoughts  Current Mental Status by Physician: Mental Status Examination: Patient appeared as per his stated age, casually dressed, and fairly groomed, and maintaining good eye contact. Patient has good mood and his affect was constricted. He has normal rate, rhythm, and volume of speech. His thought process is linear and goal directed. Patient has denied suicidal, homicidal ideations, intentions or plans. Patient has no evidence of auditory or visual hallucinations, delusions, and paranoia. Patient has fair insight judgment and impulse control.  Loss Factors: Decrease in vocational status and Financial problems/change in socioeconomic status  Historical Factors: Prior suicide attempts, Family history of suicide, Family history of mental illness or substance abuse and Impulsivity  Risk Reduction Factors:   Sense of responsibility to family, Religious beliefs about death, Living with another person, especially a relative, Positive social support, Positive therapeutic relationship and Positive coping skills or problem solving skills  Continued Clinical Symptoms:  Depression:   Recent sense of peace/wellbeing Alcohol/Substance Abuse/Dependencies  Cognitive Features That Contribute To Risk:  Closed-mindedness    Suicide Risk:  Minimal: No identifiable suicidal ideation.  Patients presenting with no risk factors but with morbid ruminations; may be classified as minimal risk based on the severity of the depressive symptoms  Discharge Diagnoses:   AXIS I:  Mood Disorder NOS and Polysubstance abuse AXIS II:  Deferred AXIS III:   Past Medical History  Diagnosis Date  . Mood disorder     anger management  . Polysubstance abuse   . Alcohol abuse   . H/O: substance abuse   .  Panic attack   . Seasonal allergies   . Irritable bowel syndrome 06/27/2013   AXIS IV:  economic problems, occupational problems, other psychosocial or environmental problems, problems related to social environment and problems with primary support group AXIS V:  61-70 mild symptoms  Plan Of Care/Follow-up recommendations:  Activity:  As tolerated Diet:  Regular  Is patient on multiple antipsychotic therapies at discharge:  No   Has Patient had three or more failed trials of antipsychotic monotherapy by history:  No  Recommended Plan for Multiple Antipsychotic Therapies: NA  Clelia Trabucco,JANARDHAHA R. 07/01/2013, 1:01 PM

## 2013-07-01 NOTE — Progress Notes (Signed)
Pt discharged @ 1935 to lobby with father.  No distress noted.  No complaints voiced.  Pt d/c with discharge instructions, prescriptions and medications.

## 2013-07-01 NOTE — Progress Notes (Signed)
Adult Psychoeducational Group Note  Date:  07/01/2013 Time:  1:53 PM  Group Topic/Focus:  Early Warning Signs:   The focus of this group is to help patients identify signs or symptoms they exhibit before slipping into an unhealthy state or crisis.  Participation Level:  Active  Participation Quality:  Appropriate and Attentive  Affect:  Appropriate  Cognitive:  Alert and Appropriate  Insight: Good  Engagement in Group:  Engaged  Modes of Intervention:  Discussion, Exploration, Socialization and Support  Additional Comments:  Pt came to group and shared that rage and racing thoughts and paranoia were his two early warning signs for relapse.  Cathlean Cower 07/01/2013, 1:53 PM

## 2013-07-01 NOTE — Progress Notes (Signed)
[facility] Adult Case Management Discharge Plan :  Will you be returning to the same living situation after discharge: Yes,  Patient returning home with family At discharge, do you have transportation home?:Yes,  Patient to arrange transporation home. Do you have the ability to pay for your medications:Yes,  Patient is able to afford co- pay  Release of information consent forms completed and in the chart;  Patient's signature needed at discharge.  Patient to Follow up at: Follow-up Information   Follow up with Dr. Madaline Guthrie - [facility] On 08/04/2013. (Thursday, August 04, 2013 at 4:00 PM)    Contact information:   [address] [place], [place]   [address]   [phone]      Follow up with [facility] On 07/04/2013. (Monday, July 04, 2013 at 1:00 PM)    Contact information:   [address] [place], [place]   [address]  [phone]      Patient denies SI/HI:   Patient will no longer endorse SI/other thought self harm'     Safety Planning and Suicide Prevention discussed:  .Reviewed with all patients during discharge planning group   Zenaya Ulatowski, Joesph July 07/01/2013, 12:28 PM

## 2013-07-01 NOTE — Progress Notes (Signed)
D: Pt in bed resting with no complaints at this time.  A:  Continued support and availability as needed was extended to this pt. Staff continue to monitor pt with q40min checks.  R: Pt remains safe at this time.

## 2013-07-06 NOTE — Progress Notes (Signed)
Patient Discharge Instructions:  After Visit Summary (AVS):   Faxed to:  07/06/13 Discharge Summary Note:   Faxed to:  07/06/13 Psychiatric Admission Assessment Note:   Faxed to:  07/06/13 Suicide Risk Assessment - Discharge Assessment:   Faxed to:  07/06/13 Faxed/Sent to the Next Level Care provider:  07/06/13 Faxed to [facility] @ [phone] Faxed to [facility] @ [phone]  Jerelene Redden, 07/06/2013, 3:45 PM

## 2013-08-15 ENCOUNTER — Encounter: Payer: Self-pay | Admitting: Internal Medicine

## 2013-08-15 ENCOUNTER — Ambulatory Visit (INDEPENDENT_AMBULATORY_CARE_PROVIDER_SITE_OTHER): Payer: 59 | Admitting: Internal Medicine

## 2013-08-15 VITALS — BP 120/72 | HR 68 | Ht 72.5 in | Wt 187.8 lb

## 2013-08-15 DIAGNOSIS — J302 Other seasonal allergic rhinitis: Secondary | ICD-10-CM

## 2013-08-15 DIAGNOSIS — J309 Allergic rhinitis, unspecified: Secondary | ICD-10-CM

## 2013-08-15 MED ORDER — AZELASTINE-FLUTICASONE 137-50 MCG/ACT NA SUSP: 1.0000 | Freq: Every day | NASAL | Status: DC

## 2013-08-15 NOTE — Patient Instructions (Signed)
Script for Dymista nasal spray  Flu vax  Try otc saline nasal spray and/ or gel as much as you want for dry nose

## 2013-08-15 NOTE — Progress Notes (Signed)
[birth year]- 20 yoM former smoker c/o wheezing, sob, and cough with yellow/ green mucus, and  chest tightness. States has had symptoms before,  but has gotten worse in the last 3 days.  Denies chest pain.   Primary complaint is nasal congestion with sore throat. Symptoms are perennial over at least the past several years. Occasional sneeze. Wakes with a sore throat. No history of asthma, myringotomy tubes or ENT surgery. Doesn't know about snoring. Experimented with cigarettes for a few months, stopping 4 months ago. No other health problems. Symptoms were improved by drinking water to ease sore throat. 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 Mother had allergy treated with allergy shots   08/13/12-21 yoM former smoker followed for allergic rhinitis, asthma with bronchitis FOLLOWS FOR: Pt given Dymista last Ov and needs sample/refills--states helps well. Pt states that coughing and wheezing  has improved. Pt states he still has sob but with anxiety only. He is feeling fairly well. Chest feels clear. Admits some nasal congestion and drainage. He avoids decongestants because he has a "phobia" about urinary retention that occurred on a medication while in the hospital once. We discussed this.  08/15/13- 22 yoM former smoker followed for allergic rhinitis, asthma with bronchitis Allergic Rhinitits. Pt has been out of dymista for a while. He has stuffy nose off and on-worse during fall months.  Remains off of cigarettes. Notices nasal stuffiness. Asks refill Dymista. After some review, we found he is already had flu shot this fall, but had forgotten (!?).  ROS-see HPI Constitutional:   No-   weight loss, night sweats, fevers, chills, fatigue, lassitude. HEENT:   No-  headaches, difficulty swallowing, tooth/dental problems, sore throat,       No-sneezing, itching, ear ache, +nasal congestion, post nasal drip,  CV:  No-   chest pain, orthopnea, PND, swelling in lower extremities, anasarca,  dizziness, palpitations Resp: No-   shortness of breath with exertion or at rest.              No-   productive cough,  No non-productive cough,  No- coughing up of blood.              No-   change in color of mucus.  No- wheezing.   Skin: No-   rash or lesions. GI:  No-   heartburn, indigestion, abdominal pain, nausea, vomiting,  GU: . MS:  No-   joint pain or swelling.   Neuro-     nothing unusual Psych:  No- change in mood or affect. No depression or anxiety.  No memory loss.  OBJ- Physical Exam General- Alert, Oriented, Affect-appropriate, Distress- none acute Skin- rash-none, lesions- none, excoriation- none Lymphadenopathy- none Head- atraumatic            Eyes- Gross vision intact, PERRLA, conjunctivae and secretions clear            Ears- Hearing, canals + cerumen            Nose- + turbinate edema, no-Septal dev, mucus, polyps, erosion, perforation             Throat- Mallampati II-III , mucosa clear , drainage- none, tonsils 2+ Neck- flexible , trachea midline, no stridor , thyroid nl, carotid no bruit Chest - symmetrical excursion , unlabored           Heart/CV- RRR , no murmur , no gallop  , no rub, nl s1 s2                           -  JVD- none , edema- none, stasis changes- none, varices- none           Lung- clear to P&A, wheeze- none, cough- none , dullness-none, rub- none           Chest wall-  Abd-  Br/ Gen/ Rectal- Not done, not indicated Extrem- cyanosis- none, clubbing, none, atrophy- none, strength- nl Neuro- grossly intact to observation

## 2013-08-29 ENCOUNTER — Encounter: Payer: Self-pay | Admitting: Internal Medicine

## 2013-08-29 NOTE — Assessment & Plan Note (Signed)
Adequate control with nasal saline gel and Dymista nasal spray. We reviewed these

## 2013-12-28 ENCOUNTER — Encounter (HOSPITAL_COMMUNITY): Payer: Self-pay | Admitting: *Deleted

## 2013-12-28 ENCOUNTER — Encounter (HOSPITAL_COMMUNITY): Payer: Self-pay | Admitting: Emergency Medicine

## 2013-12-28 ENCOUNTER — Inpatient Hospital Stay (HOSPITAL_COMMUNITY)
Admission: AD | Admit: 2013-12-28 | Discharge: 2014-01-02 | DRG: 885 | Disposition: A | Discharge status: Home / Self Care | Payer: 59 | Source: Intra-hospital | Attending: Psychiatry | Admitting: Psychiatry

## 2013-12-28 ENCOUNTER — Emergency Department (HOSPITAL_COMMUNITY)
Admission: EM | Admit: 2013-12-28 | Discharge: 2013-12-28 | Disposition: A | Discharge status: Other Acute Inpatient Hospital | Payer: 59 | Attending: Emergency Medicine | Admitting: Emergency Medicine

## 2013-12-28 DIAGNOSIS — Z87891 Personal history of nicotine dependence: Secondary | ICD-10-CM

## 2013-12-28 DIAGNOSIS — R45851 Suicidal ideations: Secondary | ICD-10-CM

## 2013-12-28 DIAGNOSIS — X789XXA Intentional self-harm by unspecified sharp object, initial encounter: Secondary | ICD-10-CM | POA: Insufficient documentation

## 2013-12-28 DIAGNOSIS — F419 Anxiety disorder, unspecified: Secondary | ICD-10-CM

## 2013-12-28 DIAGNOSIS — Z598 Other problems related to housing and economic circumstances: Secondary | ICD-10-CM

## 2013-12-28 DIAGNOSIS — F39 Unspecified mood [affective] disorder: Secondary | ICD-10-CM

## 2013-12-28 DIAGNOSIS — G4700 Insomnia, unspecified: Secondary | ICD-10-CM | POA: Diagnosis present

## 2013-12-28 DIAGNOSIS — Z79899 Other long term (current) drug therapy: Secondary | ICD-10-CM | POA: Insufficient documentation

## 2013-12-28 DIAGNOSIS — Z8719 Personal history of other diseases of the digestive system: Secondary | ICD-10-CM | POA: Insufficient documentation

## 2013-12-28 DIAGNOSIS — F3289 Other specified depressive episodes: Secondary | ICD-10-CM | POA: Insufficient documentation

## 2013-12-28 DIAGNOSIS — F1010 Alcohol abuse, uncomplicated: Secondary | ICD-10-CM

## 2013-12-28 DIAGNOSIS — G4710 Hypersomnia, unspecified: Secondary | ICD-10-CM | POA: Diagnosis present

## 2013-12-28 DIAGNOSIS — IMO0002 Reserved for concepts with insufficient information to code with codable children: Secondary | ICD-10-CM | POA: Insufficient documentation

## 2013-12-28 DIAGNOSIS — Z23 Encounter for immunization: Secondary | ICD-10-CM | POA: Insufficient documentation

## 2013-12-28 DIAGNOSIS — Z91199 Patient's noncompliance with other medical treatment and regimen due to unspecified reason: Secondary | ICD-10-CM | POA: Insufficient documentation

## 2013-12-28 DIAGNOSIS — Z5987 Material hardship due to limited financial resources, not elsewhere classified: Secondary | ICD-10-CM

## 2013-12-28 DIAGNOSIS — F410 Panic disorder [episodic paroxysmal anxiety] without agoraphobia: Secondary | ICD-10-CM | POA: Insufficient documentation

## 2013-12-28 DIAGNOSIS — Z823 Family history of stroke: Secondary | ICD-10-CM

## 2013-12-28 DIAGNOSIS — S61509A Unspecified open wound of unspecified wrist, initial encounter: Secondary | ICD-10-CM | POA: Insufficient documentation

## 2013-12-28 DIAGNOSIS — F1910 Other psychoactive substance abuse, uncomplicated: Secondary | ICD-10-CM

## 2013-12-28 DIAGNOSIS — F329 Major depressive disorder, single episode, unspecified: Secondary | ICD-10-CM

## 2013-12-28 DIAGNOSIS — Z800 Family history of malignant neoplasm of digestive organs: Secondary | ICD-10-CM

## 2013-12-28 DIAGNOSIS — Z9119 Patient's noncompliance with other medical treatment and regimen: Secondary | ICD-10-CM | POA: Insufficient documentation

## 2013-12-28 DIAGNOSIS — F332 Major depressive disorder, recurrent severe without psychotic features: Principal | ICD-10-CM | POA: Diagnosis present

## 2013-12-28 DIAGNOSIS — K589 Irritable bowel syndrome without diarrhea: Secondary | ICD-10-CM | POA: Diagnosis present

## 2013-12-28 DIAGNOSIS — F32A Depression, unspecified: Secondary | ICD-10-CM

## 2013-12-28 DIAGNOSIS — F1994 Other psychoactive substance use, unspecified with psychoactive substance-induced mood disorder: Secondary | ICD-10-CM | POA: Diagnosis present

## 2013-12-28 DIAGNOSIS — F411 Generalized anxiety disorder: Secondary | ICD-10-CM | POA: Diagnosis present

## 2013-12-28 DIAGNOSIS — Z825 Family history of asthma and other chronic lower respiratory diseases: Secondary | ICD-10-CM

## 2013-12-28 HISTORY — DX: Irritable bowel syndrome, unspecified: K58.9

## 2013-12-28 LAB — CBC
HEMATOCRIT: 41.7 % (ref 39.0–52.0)
Hemoglobin: 14.9 g/dL (ref 13.0–17.0)
MCH: 31.5 pg (ref 26.0–34.0)
MCHC: 35.7 g/dL (ref 30.0–36.0)
MCV: 88.2 fL (ref 78.0–100.0)
PLATELETS: 317 10*3/uL (ref 150–400)
RBC: 4.73 MIL/uL (ref 4.22–5.81)
RDW: 12.3 % (ref 11.5–15.5)
WBC: 4.1 10*3/uL (ref 4.0–10.5)

## 2013-12-28 LAB — COMPREHENSIVE METABOLIC PANEL
ALBUMIN: 4.3 g/dL (ref 3.5–5.2)
ALK PHOS: 60 U/L (ref 39–117)
ALT: 17 U/L (ref 0–53)
AST: 25 U/L (ref 0–37)
BUN: 15 mg/dL (ref 6–23)
CHLORIDE: 103 meq/L (ref 96–112)
CO2: 24 mEq/L (ref 19–32)
Calcium: 9.6 mg/dL (ref 8.4–10.5)
Creatinine, Ser: 0.92 mg/dL (ref 0.50–1.35)
GFR calc Af Amer: >90 mL/min (ref >90)
GFR calc non Af Amer: >90 mL/min (ref >90)
Glucose, Bld: 99 mg/dL (ref 70–99)
POTASSIUM: 3.8 meq/L (ref 3.7–5.3)
Sodium: 139 mEq/L (ref 137–147)
Total Bilirubin: 0.6 mg/dL (ref 0.3–1.2)
Total Protein: 7.7 g/dL (ref 6.0–8.3)

## 2013-12-28 LAB — RAPID URINE DRUG SCREEN, HOSP PERFORMED
AMPHETAMINES: NOT DETECTED
BARBITURATES: NOT DETECTED
BENZODIAZEPINES: NOT DETECTED
Cocaine: NOT DETECTED
Opiates: NOT DETECTED
TETRAHYDROCANNABINOL: POSITIVE — AB

## 2013-12-28 LAB — ACETAMINOPHEN LEVEL: Acetaminophen (Tylenol), Serum: <15 ug/mL (ref 10–30)

## 2013-12-28 LAB — ETHANOL: Alcohol, Ethyl (B): <11 mg/dL (ref 0–11)

## 2013-12-28 LAB — SALICYLATE LEVEL: Salicylate Lvl: <2 mg/dL — ABNORMAL LOW (ref 2.8–20.0)

## 2013-12-28 MED ORDER — TRAZODONE HCL 50 MG PO TABS
50.0000 mg | ORAL_TABLET | Freq: Every evening | ORAL | Status: DC | PRN
  Administered 2013-12-28 – 2014-01-01 (×8): 50 mg via ORAL
  Filled 2013-12-28 (×13): qty 1

## 2013-12-28 MED ORDER — ZIPRASIDONE MESYLATE 20 MG IM SOLR
20.0000 mg | Freq: Once | INTRAMUSCULAR | Status: DC
  Filled 2013-12-28: qty 20

## 2013-12-28 MED ORDER — ACETAMINOPHEN 325 MG PO TABS: 650.0000 mg | ORAL_TABLET | Freq: Four times a day (QID) | ORAL | Status: DC | PRN

## 2013-12-28 MED ORDER — ALUM & MAG HYDROXIDE-SIMETH 200-200-20 MG/5ML PO SUSP: 30.0000 mL | ORAL | Status: DC | PRN

## 2013-12-28 MED ORDER — STERILE WATER FOR INJECTION IJ SOLN
INTRAMUSCULAR | Status: AC
  Filled 2013-12-28: qty 10

## 2013-12-28 MED ORDER — MAGNESIUM HYDROXIDE 400 MG/5ML PO SUSP: 30.0000 mL | Freq: Every day | ORAL | Status: DC | PRN

## 2013-12-28 MED ORDER — POLYETHYLENE GLYCOL 3350 17 G PO PACK
17.0000 g | PACK | Freq: Every day | ORAL | Status: DC
  Administered 2013-12-29 – 2014-01-02 (×5): 17 g via ORAL
  Filled 2013-12-28 (×8): qty 1

## 2013-12-28 MED ORDER — TETANUS-DIPHTH-ACELL PERTUSSIS 5-2.5-18.5 LF-MCG/0.5 IM SUSP
0.5000 mL | Freq: Once | INTRAMUSCULAR | Status: AC
  Administered 2013-12-28: 0.5 mL via INTRAMUSCULAR
  Filled 2013-12-28: qty 0.5

## 2013-12-28 MED ORDER — HYDROXYZINE HCL 25 MG PO TABS
25.0000 mg | ORAL_TABLET | Freq: Four times a day (QID) | ORAL | Status: DC | PRN
  Administered 2013-12-28: 25 mg via ORAL
  Filled 2013-12-28: qty 1

## 2013-12-28 NOTE — ED Provider Notes (Signed)
Medical screening examination/treatment/procedure(s) were conducted as a shared visit with non-physician practitioner(s) and myself.  I personally evaluated the patient during the encounter.   EKG Interpretation None      Patient presents for evaluation of depression. Patient reports multiple social stressors. Today he cut his wrist as a suicidal gesture. He called EMS, when they arrived, held the knife to his throat and police had to use TASER to subdue him. Patient continues to endorse depression and suicidality. He will require psychiatric evaluation. Has had multiple hospitalizations in the past.  Gilda Creasehristopher J. Korie Brabson, MD 12/28/13 [phone]

## 2013-12-28 NOTE — ED Notes (Signed)
Per EMS, pt has hx of anxiety and depression.  Pt is prescribed prozac and buspar.  Pt does not take them.  Pt was sitting in car on side of road and was cutting wrist.  He called someone and they called 911 stating he wanted to kill himself.  Pt was found holding knife to throat.  Pt was tazzed by [employer].  Pt has bilateral lacerations to wrist.  Pt did have hx of OD attempt 3 years ago.  Pt denies pills or alcohol today.  cbg 106, vitals: 140/78, hr 76, resp 18

## 2013-12-28 NOTE — ED Notes (Signed)
Pt is aware we need a urine sample. Is unable to void at this time.

## 2013-12-28 NOTE — ED Notes (Signed)
Bed: [id number] Expected date:  Expected time:  Means of arrival:  Comments: For tr 4

## 2013-12-28 NOTE — ED Provider Notes (Signed)
CSN: [id number]     Arrival date & time 12/28/13  1735 History  This chart was scribed for non-physician practitioner, Roxy Horseman, PA-C working with Gilda Crease, MD by Greggory Stallion, ED scribe. This patient was seen in room WTR4/WLPT4 and the patient's care was started at [facility] PM.   Chief Complaint  Patient presents with  . Medical Clearance   The history is provided by the patient and the police. No language interpreter was used.   HPI Comments: Jay Stevenson is a 23 y.o. male with history of anxiety and depression who presents to the Emergency Department for medical clearance. Pt is noncompliant with his daily medications. He was sitting on the side of the road cutting his wrists and called one of his friends. His friend called the police and they found him holding a knife to his throat. [employer] states that the pt was tazzed. Pt has only cut his wrists one other time in the past but has attempted overdosing 3 years ago. Denies HI.   Past Medical History  Diagnosis Date  . Mood disorder     anger management  . Polysubstance abuse   . Alcohol abuse   . H/O: substance abuse   . Panic attack   . Seasonal allergies   . Irritable bowel syndrome 06/27/2013   Past Surgical History  Procedure Laterality Date  . Wisdom tooth extraction     Family History  Problem Relation Age of Onset  . Depression Mother   . Colon cancer Paternal Grandfather   . Hypertension Other   . Stroke Other   . Allergies Father   . Allergies Mother   . Asthma Father   . Other Paternal Grandfather     brain tumor   History  Substance Use Topics  . Smoking status: Former Smoker -- 1 years    Types: Cigarettes    Quit date: 01/13/2012  . Smokeless tobacco: Never Used     Comment: 2-3 ciggs a week  . Alcohol Use: No     Comment: quit in Aug 2012    Review of Systems  Constitutional: Negative for fever.  HENT: Negative for congestion.   Eyes: Negative for redness.  Respiratory: Negative for  shortness of breath.   Cardiovascular: Negative for chest pain.  Gastrointestinal: Negative for abdominal distention.  Musculoskeletal: Negative for gait problem.  Skin: Positive for wound.  Neurological: Negative for speech difficulty.  Psychiatric/Behavioral: Positive for suicidal ideas and self-injury.   Allergies  Review of patient's allergies indicates no known allergies.  Home Medications   Current Outpatient Rx  Name  Route  Sig  Dispense  Refill  . Azelastine-Fluticasone (DYMISTA) 137-50 MCG/ACT SUSP   Nasal   Place 1-2 puffs into the nose at bedtime.   1 Bottle   prn   . Cholecalciferol (VITAMIN D) 2000 UNITS tablet   Oral   Take 4 tablets (8,000 Units total) by mouth daily. For Vitamin D deficiency.         Marland Kitchen FLUoxetine (PROZAC) 10 MG capsule   Oral   Take 1 capsule (10 mg total) by mouth daily. For depression.   30 capsule   0   . omega-3 acid ethyl esters (LOVAZA) 1 G capsule   Oral   Take 4 capsules (4 g total) by mouth daily. For hyperlipidemia.          BP 136/75  Pulse 106  Temp(Src) 99 F (37.2 C) (Oral)  Resp 18  SpO2 95%  Physical  Exam  Nursing note and vitals reviewed. Constitutional: He is oriented to person, place, and time. He appears well-developed. No distress.  HENT:  Head: Normocephalic and atraumatic.  Eyes: Conjunctivae and EOM are normal.  Cardiovascular: Normal rate, regular rhythm and normal heart sounds.  Exam reveals no gallop and no friction rub.   No murmur heard. Pulmonary/Chest: Effort normal and breath sounds normal. No stridor. No respiratory distress. He has no wheezes. He has no rales. He exhibits no tenderness.  Abdominal: He exhibits no distension.  Musculoskeletal: He exhibits no edema.  Neurological: He is alert and oriented to person, place, and time.  Skin: Skin is warm and dry.  Multiple superficial shallow laceration, not requiring repair, to bilateral wrists, right worse than left.   Psychiatric: He has a  normal mood and affect. He expresses suicidal ideation.    ED Course  Procedures (including critical care time)  DIAGNOSTIC STUDIES: Oxygen Saturation is 95% on RA, adequate by my interpretation.    COORDINATION OF CARE: 6:45 PM-Discussed treatment plan which includes laceration repair with pt at bedside and pt agreed to plan.   LACERATION REPAIR PROCEDURE NOTE The patient's identification was confirmed and consent was obtained. This procedure was performed by Roxy Horseman, PA-C 6:48 PM. Site: right wrist Sterile procedures observed Anesthetic used (type and amt): none Suture type/size: dermabond Length: 1 cm # of Sutures: dermabond Technique: dermabond Complexity: simple Tetanus ordered Site anesthetized, irrigated with NS, explored without evidence of foreign body, wound well approximated, site covered with dry, sterile dressing.  Patient tolerated procedure well without complications. Instructions for care discussed verbally and patient provided with additional written instructions for homecare and f/u.  Results for orders placed during the hospital encounter of 12/28/13  ACETAMINOPHEN LEVEL      Result Value Ref Range   Acetaminophen (Tylenol), Serum <15.0  10 - 30 ug/mL  CBC      Result Value Ref Range   WBC 4.1  4.0 - 10.5 K/uL   RBC 4.73  4.22 - 5.81 MIL/uL   Hemoglobin 14.9  13.0 - 17.0 g/dL   HCT 16.1  09.6 - 04.5 %   MCV 88.2  78.0 - 100.0 fL   MCH 31.5  26.0 - 34.0 pg   MCHC 35.7  30.0 - 36.0 g/dL   RDW 40.9  81.1 - 91.4 %   Platelets 317  150 - 400 K/uL  COMPREHENSIVE METABOLIC PANEL      Result Value Ref Range   Sodium 139  137 - 147 mEq/L   Potassium 3.8  3.7 - 5.3 mEq/L   Chloride 103  96 - 112 mEq/L   CO2 24  19 - 32 mEq/L   Glucose, Bld 99  70 - 99 mg/dL   BUN 15  6 - 23 mg/dL   Creatinine, Ser 7.82  0.50 - 1.35 mg/dL   Calcium 9.6  8.4 - 95.6 mg/dL   Total Protein 7.7  6.0 - 8.3 g/dL   Albumin 4.3  3.5 - 5.2 g/dL   AST 25  0 - 37 U/L    ALT 17  0 - 53 U/L   Alkaline Phosphatase 60  39 - 117 U/L   Total Bilirubin 0.6  0.3 - 1.2 mg/dL   GFR calc non Af Amer >90  >90 mL/min   GFR calc Af Amer >90  >90 mL/min  ETHANOL      Result Value Ref Range   Alcohol, Ethyl (B) <11  0 - 11  mg/dL  SALICYLATE LEVEL      Result Value Ref Range   Salicylate Lvl <2.0 (*) 2.8 - 20.0 mg/dL   No results found.   No results found.   EKG Interpretation None      MDM   Final diagnoses:  None  1. SI 2. Cutting  Patient with SI.  IVC'd by Dr. Blinda LeatherwoodPollina.  Small laceration repaired and wound care given.  TTS consult pending.    Anticipate placement.  I personally performed the services described in this documentation, which was scribed in my presence. The recorded information has been reviewed and is accurate.    Roxy Horsemanobert Antawan Mchugh, PA-C 12/28/13 1911
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
## 2013-12-29 ENCOUNTER — Encounter (HOSPITAL_COMMUNITY): Payer: Self-pay | Admitting: Family

## 2013-12-29 DIAGNOSIS — F489 Nonpsychotic mental disorder, unspecified: Secondary | ICD-10-CM

## 2013-12-29 DIAGNOSIS — F411 Generalized anxiety disorder: Secondary | ICD-10-CM

## 2013-12-29 DIAGNOSIS — F1910 Other psychoactive substance abuse, uncomplicated: Secondary | ICD-10-CM

## 2013-12-29 DIAGNOSIS — F1994 Other psychoactive substance use, unspecified with psychoactive substance-induced mood disorder: Secondary | ICD-10-CM

## 2013-12-29 MED ORDER — BUSPIRONE HCL 15 MG PO TABS
7.5000 mg | ORAL_TABLET | Freq: Two times a day (BID) | ORAL | Status: DC
  Administered 2013-12-29 – 2014-01-02 (×8): 7.5 mg via ORAL
  Filled 2013-12-29: qty 3
  Filled 2013-12-29 (×9): qty 1
  Filled 2013-12-29: qty 3
  Filled 2013-12-29 (×3): qty 1

## 2013-12-29 MED ORDER — FLUOXETINE HCL 10 MG PO CAPS
10.0000 mg | ORAL_CAPSULE | Freq: Every day | ORAL | Status: DC
  Administered 2013-12-29 – 2014-01-02 (×5): 10 mg via ORAL
  Filled 2013-12-29 (×8): qty 1
  Filled 2013-12-29: qty 3

## 2013-12-29 MED ORDER — ENSURE COMPLETE PO LIQD
237.0000 mL | Freq: Three times a day (TID) | ORAL | Status: DC
  Administered 2013-12-29 – 2014-01-02 (×9): 237 mL via ORAL
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
## 2013-12-29 NOTE — Progress Notes (Signed)
[facility] Group Notes:  (Nursing/MHT/Case Management/Adjunct)  Date:  12/29/2013  Time:  2100  Type of Therapy:  wrap up group  Participation Level:  Active  Participation Quality:  Inattentive, Redirectable, Sharing and Supportive  Affect:  Appropriate  Cognitive:  Appropriate  Insight:  Lacking  Engagement in Group:  Engaged  Modes of Intervention:  Clarification, Education and Support  Summary of Progress/Problems:  Shelah LewandowskySquires, Dereon Corkery Carol 12/29/2013, 10:51 PM
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
## 2013-12-29 NOTE — Progress Notes (Signed)
D: Patient's affect appropriate to circumstance and mood is depressed. Patient voiced that he's feeling really tired today and he believes it's due to him taking Trazodone so late last night. He reported on the self inventory sheet that he's sleeping fair, appetite is good, normal energy level and improving ability to pay attention. Patient rated depression and feelings of hopelessness "3". Writer has observed patient lying in bed all day. No scheduled medications at this time.  A: Support and encouragement provided to patient. Monitor Q15 minute checks for safety.  R: Patient receptive. Denies SI/HI and AVH. Patient remains safe on the unit.

## 2013-12-29 NOTE — Progress Notes (Signed)
Patient ID: Jay Stevenson, male   DOB: Jul 29, 1991, 23 y.o.   MRN: [id number]  [facility] Group 9 A.M.  The focus of this group is to educate the patient on the purpose and policies of crisis stabilization and provide a format to answer questions about their admission.  The group details unit policies and expectations of patients while admitted.  Patient attended group but had trouble staying awake. Patient was not very attentive and stated his goal for the day is to "stay awake."

## 2013-12-29 NOTE — [facility] Suicide Risk Assessment (Signed)
[facility] INPATIENT:  Family/Significant Other Suicide Prevention Education  Suicide Prevention Education:  Patient Refusal for Family/Significant Other Suicide Prevention Education: The patient Jay Stevenson has refused to provide written consent for family/significant other to be provided Family/Significant Other Suicide Prevention Education during admission and/or prior to discharge.  Physician notified.  Wynn BankerHodnett, Breeona Waid Hairston 12/29/2013, 3:11 PM

## 2013-12-29 NOTE — Progress Notes (Signed)
NUTRITION ASSESSMENT  Pt identified as at risk on the Malnutrition Screen Tool  INTERVENTION: 1. Educated patient on the importance of nutrition and encouraged intake of food and beverages. 2. Recommend MD order Miralax for pt's constipation per pt request  3. Supplements: Ensure Complete TID  NUTRITION DIAGNOSIS: Unintentional weight loss related to sub-optimal intake as evidenced by pt report.   Goal: Pt to meet >/= 90% of their estimated nutrition needs.  Monitor:  PO intake  Assessment:  Pt involuntarily committed due cutting wrists in a suicide attempt.  Met with pt who reports he used to weigh 200 pounds, now weighs 177 pounds. Unsure of time frame of weight loss but reports it occurred because of all the stress he was under. Eats 1-2 meals/day. Requests Ensure for additional nutrition. Reports taking Miralax at home for constipation, requests some during admission.   23 y.o. male  Height: Ht Readings from Last 1 Encounters:  12/28/13 5' 9.75" (1.772 m)    Weight: Wt Readings from Last 1 Encounters:  12/28/13 177 lb (80.287 kg)    Weight Hx: Wt Readings from Last 10 Encounters:  12/28/13 177 lb (80.287 kg)  12/28/13 190 lb (86.183 kg)  08/15/13 187 lb 12.8 oz (85.186 kg)  02/18/13 182 lb (82.555 kg)  10/29/12 174 lb 1.3 oz (78.962 kg)  08/13/12 177 lb 3.2 oz (80.377 kg)  07/22/12 175 lb (79.379 kg)  05/14/12 174 lb 9.6 oz (79.198 kg) (75%*, Z = 0.68)  04/14/12 172 lb (78.019 kg) (73%*, Z = 0.60)  12/17/11 176 lb (79.833 kg) (78%*, Z = 0.77)   * Growth percentiles are based on CDC 2-20 Years data.    BMI:  Body mass index is 25.57 kg/(m^2). Pt meets criteria for overweight based on current BMI.  Estimated Nutritional Needs: Kcal: 25-30 kcal/kg Protein: > 1 gram protein/kg Fluid: 1 ml/kcal  Diet Order: General Pt is also offered choice of unit snacks mid-morning and mid-afternoon.  Pt is eating as desired.   Lab results and medications reviewed.    Mikey College MS, [place], [place] Pager [phone] After Hours Pager

## 2013-12-29 NOTE — Progress Notes (Signed)
Adult Psychoeducational Group Note  Date:  12/29/2013 Time: 10:00am Group Topic/Focus:  Personal Choices and Values:   The focus of this group is to help patients assess and explore the importance of values in their lives, how their values affect their decisions, how they express their values and what opposes their expression.  Participation Level:  Active  Participation Quality:  Appropriate and Attentive  Affect:  Appropriate  Cognitive:  Alert and Appropriate  Insight: Appropriate  Engagement in Group:  Engaged  Modes of Intervention:  Education  Additional Comments:  Pt attended and participated in group. Discussion was on making lifestyle changes. Pt was asked what is one change they could make to improve quality of life. Pt stated to cut off connections with some people.  Shelly BombardGarner, Leveda Kendrix D 12/29/2013, 6:52 PM
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
## 2013-12-29 NOTE — Tx Team (Signed)
Initial Interdisciplinary Treatment Plan  PATIENT STRENGTHS: (choose at least two) Average or above average intelligence Communication skills General fund of knowledge Motivation for treatment/growth Supportive family/friends  PATIENT STRESSORS: Health problems Marital or family conflict Substance abuse   PROBLEM LIST: Problem List/Patient Goals Date to be addressed Date deferred Reason deferred Estimated date of resolution  SA 12-28-13     Depression 12-28-13     IBS 12-28-13                                          DISCHARGE CRITERIA:  Adequate post-discharge living arrangements Improved stabilization in mood, thinking, and/or behavior Motivation to continue treatment in a less acute level of care Need for constant or close observation no longer present Reduction of life-threatening or endangering symptoms to within safe limits  PRELIMINARY DISCHARGE PLAN: Outpatient therapy Participate in family therapy Return to previous living arrangement  PATIENT/FAMIILY INVOLVEMENT: This treatment plan has been presented to and reviewed with the patient, Jay Stevenson, and/or family member.  The patient and family have been given the opportunity to ask questions and make suggestions.  Mickeal NeedyJohnson, Ivin Rosenbloom N 12/29/2013, 4:43 AM

## 2013-12-29 NOTE — H&P (Signed)
Psychiatric Admission Assessment Adult  Patient Identification:  Jay Stevenson Date of Evaluation:  12/29/2013 Chief Complaint:  MAJOR DEPRESSIVE DISORDER History of Present Illness:: Pt. Is a 23 yo male, involuntarily committed due cutting wrists in a suicide attempt. According to assessment note pt admits to calling a friend and starting a text argument with ex-GF, believing that the conflict would help him work up the nerve to fatality cut himself. One of the friends called the police, when they went to his home he was found in the act of cutting himself. He refused to give up the knife and was tased. Pt. Was living in [place] with his GF, working and saving money, then the relationship ended forcing him to return to [place] about a 1 1/2 weeks ago, to live with his parents. Stressors: thinks GF sabotaged a number of his friendships by sending comments about him, a friend and acquaintance that completed suicide this year, and his pet cat died this past year. Pt. Has had several admission at [facility] most recent 9/14, also 7/11, 8/11 on C/A unit. Pt. Currently contracts for safety. Bandages to wrist redressed, no drainage or swelling, sites are superficial with the most cuts to the right arm. Pt. Admits to cutting in his history to relieve stress. Pt. Has history of suicide attempt in 2011, by overdose. Pt. Has a history of substance abuse, clean for over a year, but UDS positive for THC. Pt. Reports "I don't know how that got there. Unless someone put something in my cigarettes." Pt. Reports he is not an avid smoker though, reports he quit a year ago. Pt. Has conflicting stories. Pt. Clearly irritable during this admission process, fidgeting, and limited eye contact. Pt. Has a medical history of IBS. Pt. Given food/drink, oriented to unit/room. Writer staffed with Patriciaann Clan, PA, received medications orders. Staff will monitor q110mn for safety. Pt. Is safe on the unit.   During admission assessment, pt rates  anxiety and depression very high, stating that he was overwhelmed at the time of his suicide attempt and that his stressors are still occuring with  no job, [place], and feelings of social isolation from alienating friends. Pt denies current SI, HI, and AVH, contracts for safety. Pt is shifting in his seat constantly, avoiding eye contact, and appears to be very anxious. Reports conflict with police involving the standoff mentioned above, stating that he put the knife to his throat and refused to stop so he was finally tased. Pt reports that the triggering event for all of this was a text message conflict between himself and 2 ex girlfriends at the same time. Reports history of Prozac/Buspar and wants to restart therapy on these, stating effectiveness. Denies ETOH recently aside from a month ago. Denies current drug use, although THC is in UDS.   Elements:  Location:  Generalized, [facility]. Quality:  Worsening. Severity:  Severe. Timing:  Constant. Duration:  Chronic. Context:  See above narrative. Associated Signs/Synptoms: Depression Symptoms:  depressed mood, anhedonia, insomnia, hypersomnia, psychomotor agitation, fatigue, feelings of worthlessness/guilt, difficulty concentrating, hopelessness, suicidal attempt, anxiety, insomnia, loss of energy/fatigue, disturbed sleep, weight loss, decreased appetite, (Hypo) Manic Symptoms:  Denies Anxiety Symptoms:  Excessive Worry, Psychotic Symptoms:  "See some things like maybe people out of the corner of my eye but they are not there when I look; I think most people see that".  PTSD Symptoms: NA Total Time spent with patient: 30 minutes  Psychiatric  Specialty Exam: Physical Exam Full Physical Exam performed in ED; reviewed, stable, and I concur with this assessment.   Review of Systems  Constitutional: Negative.   HENT: Negative.   Eyes: Negative.   Respiratory: Negative.    Cardiovascular: Negative.   Gastrointestinal: Negative.   Genitourinary: Negative.   Musculoskeletal: Negative.   Skin: Negative.   Neurological: Negative.   Endo/Heme/Allergies: Negative.   Psychiatric/Behavioral: Positive for depression and suicidal ideas (bilateral forearm lacerations). The patient is nervous/anxious and has insomnia.     Blood pressure 125/85, pulse 63, temperature 97.8 F (36.6 C), temperature source Oral, resp. rate 18, height 5' 9.75" (1.772 m), weight 80.287 kg (177 lb), SpO2 98.00%.Body mass index is 25.57 kg/(m^2).  General Appearance: Casual  Eye Contact::  Good  Speech:  Clear and Coherent  Volume:  Normal  Mood:  Anxious and Depressed  Affect:  Appropriate and Depressed  Thought Process:  Coherent  Orientation:  Full (Time, Place, and Person)  Thought Content:  WDL  Suicidal Thoughts:  No (denies current; has lac's to bilateral wrists from knife)  Homicidal Thoughts:  No  Memory:  Immediate;   Good Recent;   Good Remote;   Good  Judgement:  Fair  Insight:  Fair  Psychomotor Activity:  Normal  Concentration:  Good  Recall:  Good  Fund of Knowledge:Good  Language: Good  Akathisia:  NA  Handed:    AIMS (if indicated):     Assets:  Communication Skills Desire for Improvement Resilience  Sleep:  Number of Hours: 5.5    Musculoskeletal: Strength & Muscle Tone: within normal limits Gait & Station: normal Patient leans: N/A  Past Psychiatric History: Diagnosis: MDD with SI and attempt  Hospitalizations:[facility] most recent 9/14, also 7/11, 8/11 on C/A unit.  Outpatient Care: Sees local therapist  Substance Abuse Care: 12yr ago for meth/cannabis  Self-Mutilation: Denies  Suicidal Attempts: Current  Violent Behaviors: Denies   Past Medical History:   Past Medical History  Diagnosis Date  . Mood disorder     anger management  . Polysubstance abuse   . Alcohol abuse   . H/O: substance abuse   . Panic attack   . Seasonal allergies   .  Irritable bowel syndrome 06/27/2013  . IBS (irritable bowel syndrome)    None. Allergies:  No Known Allergies PTA Medications: No prescriptions prior to admission    Previous Psychotropic Medications:  Medication/Dose  SEE MAR               Substance Abuse History in the last 12 months:  yes  Consequences of Substance Abuse: Medical Consequences:  mood alteration  Social History:  reports that he quit smoking about 1 years ago. His smoking use included Cigarettes. He smoked 0.00 packs per day for 1 year. He has never used smokeless tobacco. He reports that he does not drink alcohol or use illicit drugs. Additional Social History:                      [place] Family Members: mom/dad Marital Status:  Single Children: Denies  Sons:  Daughters: Relationships: Single (recent breakup) Education:  HS and some college Educational Problems/Performance:Denies Religious Beliefs/Practices:  History of Abuse (Emotional/Phsycial/Sexual) OShip brokerHistory:  Denies Legal History: Standoffs w/police Hobbies/Interests:   Family History:   Family History  Problem Relation Age of Onset  . Depression Mother   . Colon cancer Paternal Grandfather   .  Hypertension Other   . Stroke Other   . Allergies Father   . Allergies Mother   . Asthma Father   . Other Paternal Grandfather     brain tumor    Results for orders placed during the hospital encounter of 12/28/13 (from the past 72 hour(s))  ACETAMINOPHEN LEVEL     Status: None   Collection Time    12/28/13  6:05 PM      Result Value Ref Range   Acetaminophen (Tylenol), Serum <15.0  10 - 30 ug/mL   Comment:            THERAPEUTIC CONCENTRATIONS VARY     SIGNIFICANTLY. A RANGE OF 10-30     ug/mL MAY BE AN EFFECTIVE     CONCENTRATION FOR MANY PATIENTS.     HOWEVER, SOME ARE BEST TREATED     AT CONCENTRATIONS OUTSIDE THIS     RANGE.      ACETAMINOPHEN CONCENTRATIONS     >150 ug/mL AT 4 HOURS AFTER     INGESTION AND >50 ug/mL AT 12     HOURS AFTER INGESTION ARE     OFTEN ASSOCIATED WITH TOXIC     REACTIONS.  CBC     Status: None   Collection Time    12/28/13  6:05 PM      Result Value Ref Range   WBC 4.1  4.0 - 10.5 K/uL   RBC 4.73  4.22 - 5.81 MIL/uL   Hemoglobin 14.9  13.0 - 17.0 g/dL   HCT 41.7  39.0 - 52.0 %   MCV 88.2  78.0 - 100.0 fL   MCH 31.5  26.0 - 34.0 pg   MCHC 35.7  30.0 - 36.0 g/dL   RDW 12.3  11.5 - 15.5 %   Platelets 317  150 - 400 K/uL  COMPREHENSIVE METABOLIC PANEL     Status: None   Collection Time    12/28/13  6:05 PM      Result Value Ref Range   Sodium 139  137 - 147 mEq/L   Potassium 3.8  3.7 - 5.3 mEq/L   Chloride 103  96 - 112 mEq/L   CO2 24  19 - 32 mEq/L   Glucose, Bld 99  70 - 99 mg/dL   BUN 15  6 - 23 mg/dL   Creatinine, Ser 0.92  0.50 - 1.35 mg/dL   Calcium 9.6  8.4 - 10.5 mg/dL   Total Protein 7.7  6.0 - 8.3 g/dL   Albumin 4.3  3.5 - 5.2 g/dL   AST 25  0 - 37 U/L   ALT 17  0 - 53 U/L   Alkaline Phosphatase 60  39 - 117 U/L   Total Bilirubin 0.6  0.3 - 1.2 mg/dL   GFR calc non Af Amer >90  >90 mL/min   GFR calc Af Amer >90  >90 mL/min   Comment: (NOTE)     The eGFR has been calculated using the CKD EPI equation.     This calculation has not been validated in all clinical situations.     eGFR's persistently <90 mL/min signify possible Chronic Kidney     Disease.  ETHANOL     Status: None   Collection Time    12/28/13  6:05 PM      Result Value Ref Range   Alcohol, Ethyl (B) <11  0 - 11 mg/dL   Comment:            LOWEST DETECTABLE  LIMIT FOR     SERUM ALCOHOL IS 11 mg/dL     FOR MEDICAL PURPOSES ONLY  SALICYLATE LEVEL     Status: Abnormal   Collection Time    12/28/13  6:05 PM      Result Value Ref Range   Salicylate Lvl <2.7 (*) 2.8 - 20.0 mg/dL  URINE RAPID DRUG SCREEN (HOSP PERFORMED)     Status: Abnormal   Collection Time    12/28/13  7:17 PM      Result  Value Ref Range   Opiates NONE DETECTED  NONE DETECTED   Cocaine NONE DETECTED  NONE DETECTED   Benzodiazepines NONE DETECTED  NONE DETECTED   Amphetamines NONE DETECTED  NONE DETECTED   Tetrahydrocannabinol POSITIVE (*) NONE DETECTED   Barbiturates NONE DETECTED  NONE DETECTED   Comment:            DRUG SCREEN FOR MEDICAL PURPOSES     ONLY.  IF CONFIRMATION IS NEEDED     FOR ANY PURPOSE, NOTIFY LAB     WITHIN 5 DAYS.                LOWEST DETECTABLE LIMITS     FOR URINE DRUG SCREEN     Drug Class       Cutoff (ng/mL)     Amphetamine      1000     Barbiturate      200     Benzodiazepine   062     Tricyclics       376     Opiates          300     Cocaine          300     THC              50   Psychological Evaluations:  Assessment:   DSM5:  Substance/Addictive Disorders:  Hx of Cannabis, Meth abuse; sober 2+yrs) Depressive Disorders:  Major Depressive Disorder - Severe (296.23)  AXIS I:  Generalized Anxiety Disorder, Major Depression, Recurrent severe, Substance Abuse and Substance Induced Mood Disorder AXIS II:  Deferred AXIS III:   Past Medical History  Diagnosis Date  . Mood disorder     anger management  . Polysubstance abuse   . Alcohol abuse   . H/O: substance abuse   . Panic attack   . Seasonal allergies   . Irritable bowel syndrome 06/27/2013  . IBS (irritable bowel syndrome)    AXIS IV:  economic problems, occupational problems, other psychosocial or environmental problems, problems related to social environment and problems with primary support group AXIS V:  41-50 serious symptoms  Treatment Plan/Recommendations:   Review of chart, vital signs, medications, and notes.  1-Individual and group therapy  2-Medication management for depression and anxiety: Medications reviewed with the patient and she stated no untoward effects.  -Start Prozac 76m daily for depression -Start Buspar 7.542mbid for mood stabilization  3-Coping skills for depression, anxiety   4-Continue crisis stabilization and management  5-Address health issues--monitoring vital signs, stable  6-Treatment plan in progress to prevent relapse of depression and anxiety  Treatment Plan Summary: Daily contact with patient to assess and evaluate symptoms and progress in treatment Medication management Current Medications:  Current Facility-Administered Medications  Medication Dose Route Frequency Provider Last Rate Last Dose  . acetaminophen (TYLENOL) tablet 650 mg  650 mg Oral Q6H PRN SpLaverle HobbyPA-C      . alum & mag hydroxide-simeth (  MAALOX/MYLANTA) 200-200-20 MG/5ML suspension 30 mL  30 mL Oral Q4H PRN Laverle Hobby, PA-C      . feeding supplement (ENSURE COMPLETE) (ENSURE COMPLETE) liquid 237 mL  237 mL Oral TID BM Christie Beckers, RD      . hydrOXYzine (ATARAX/VISTARIL) tablet 25 mg  25 mg Oral Q6H PRN Laverle Hobby, PA-C   25 mg at 12/28/13 2352  . magnesium hydroxide (MILK OF MAGNESIA) suspension 30 mL  30 mL Oral Daily PRN Laverle Hobby, PA-C      . polyethylene glycol (MIRALAX / GLYCOLAX) packet 17 g  17 g Oral Daily Laverle Hobby, PA-C      . traZODone (DESYREL) tablet 50 mg  50 mg Oral QHS,MR X 1 Spencer E Simon, PA-C   50 mg at 12/28/13 2352    Observation Level/Precautions:  15 minute checks  Laboratory:  Labs resulted, reviewed, and stable at this time.   Psychotherapy:  Group therapy, individual therapy, psychoeducation  Medications:  See MAR above  Consultations: None    Discharge Concerns: None    Estimated LOS: 5-7 days  Other:  N/A     I certify that inpatient services furnished can reasonably be expected to improve the patient's condition.   Benjamine Mola, [place] 4/2/20155:37 PM  Patient was seen face to face for psychiatric evaluation, suicide risk assessment and case discussed with treatment team and physician extender, formulated treatment plan. Reviewed the information documented and agree with the treatment  plan.  Joanna Hall,JANARDHAHA R. 12/30/2013 6:21 PM

## 2013-12-30 DIAGNOSIS — F332 Major depressive disorder, recurrent severe without psychotic features: Principal | ICD-10-CM

## 2013-12-30 DIAGNOSIS — X789XXA Intentional self-harm by unspecified sharp object, initial encounter: Secondary | ICD-10-CM

## 2013-12-30 NOTE — Progress Notes (Signed)
Child/Adolescent Psychoeducational Group Note  Date:  12/30/2013 Time:  11:40 PM  Group Topic/Focus:  Wrap-Up Group:   The focus of this group is to help patients review their daily goal of treatment and discuss progress on daily workbooks.  Participation Level:  Active  Participation Quality:  Appropriate  Affect:  Appropriate  Cognitive:  Appropriate  Insight:  Appropriate  Engagement in Group:  Engaged  Modes of Intervention:  Discussion  Additional Comments:  Pt attended the wrap up group this evening and remained appropriate and engaged throughout the course of the group. Pt shared that one positive thing about his day was that he got to go outside. Pt ranked his day as a 7 because he had a decent day overall.  Sheran Lawlesseese, [place] Hol[place] O 12/30/2013, 11:40 PM

## 2013-12-30 NOTE — [facility] Group Notes (Signed)
[facility]   12/30/2013 8:45 AM  Participation Quality:  Alert, Appropriate and Oriented  Mood/Affect:  Flat and Depressed  Depression Rating:  5  Anxiety Rating:  5  Thoughts of Suicide:  Pt denies SI/HI  Will you contract for safety?   Yes  Current AVH:  Pt denies  Plan for Discharge/Comments:  Pt attended discharge planning group and actively participated in group.  CSW provided pt with today's workbook.  [place] and has follow up scheduled at [facility] for outpatient medication management.  No further needs voiced by pt at this time.    Transportation Means: Pt reports access to transportation - father will pick pt up  Supports: No supports mentioned at this time  Reyes IvanChelsea Horton, LCSW 12/30/2013 10:12 AM

## 2013-12-30 NOTE — Progress Notes (Signed)
[facility] MD Progress Note  12/30/2013 1:57 PM Jay Stevenson  MRN:  [id number] Subjective:  Met with Jay Stevenson today to discuss his care and response to treatment. He reports that his symptoms of suicidal ideation and depression are better than upon arrival but as good as they could be. His SI is decreasing and he rates his depression as a 6/10. He notes his anxiety is also a 6/10.        He notes that he has slept well and his appetite is pretty good. He does go on to say that he doesn't feel that he will be ready to discharge when his "3 days are up. And I just don't want to be kicked out..." Objective:  Patient is up and active on the unit. Missed 1 group yesterday. He is flat affected and appears depressed. Speech is clear, mod is depressed. Diagnosis:   DSM5: Schizophrenia Disorders:   Obsessive-Compulsive Disorders:   Trauma-Stressor Disorders:   Substance/Addictive Disorders:   Depressive Disorders:  MDD recurrent severe w/o psychotic features Total Time spent with patient: 20 minutes  Axis I  MDD recurrent severe w/o psychotic features, suicide attempt.  ADL's:  Intact  Sleep: Good  Appetite:  Fair  Suicidal Ideation:  Decreasing, no intent, no means, no plan and can contract for safety. Homicidal Ideation:  denies AEB (as evidenced by):  Psychiatric Specialty Exam: Physical Exam  ROS  Blood pressure 143/72, pulse 89, temperature 97.1 F (36.2 C), temperature source Oral, resp. rate 16, height 5' 9.75" (1.772 m), weight 80.287 kg (177 lb), SpO2 98.00%.Body mass index is 25.57 kg/(m^2).  General Appearance: Disheveled  Eye Contact::  Good  Speech:  Clear and Coherent  Volume:  Normal  Mood:  Depressed  Affect:  Congruent  Thought Process:  Goal Directed  Orientation:  Full (Time, Place, and Person)  Thought Content:  WDL  Suicidal Thoughts:  Yes.  without intent/plan  Homicidal Thoughts:  No  Memory:  NA  Judgement:  Poor  Insight:  Shallow  Psychomotor Activity:  Normal   Concentration:  Fair  Recall:  [place]  Language: Good  Akathisia:  No  Handed:  Right  AIMS (if indicated):     Assets:  Communication Skills Desire for Improvement Housing Physical Health Resilience  Sleep:  Number of Hours: 5.75   Musculoskeletal: Strength & Muscle Tone: within normal limits Gait & Station: normal Patient leans: N/A  Current Medications: Current Facility-Administered Medications  Medication Dose Route Frequency Provider Last Rate Last Dose  . acetaminophen (TYLENOL) tablet 650 mg  650 mg Oral Q6H PRN Laverle Hobby, PA-C      . alum & mag hydroxide-simeth (MAALOX/MYLANTA) 200-200-20 MG/5ML suspension 30 mL  30 mL Oral Q4H PRN Laverle Hobby, PA-C      . busPIRone (BUSPAR) tablet 7.5 mg  7.5 mg Oral BID Benjamine Mola, FNP   7.5 mg at 12/30/13 0807  . feeding supplement (ENSURE COMPLETE) (ENSURE COMPLETE) liquid 237 mL  237 mL Oral TID BM Christie Beckers, RD   237 mL at 12/29/13 2141  . FLUoxetine (PROZAC) capsule 10 mg  10 mg Oral Daily Benjamine Mola, FNP   10 mg at 12/30/13 7829  . hydrOXYzine (ATARAX/VISTARIL) tablet 25 mg  25 mg Oral Q6H PRN Laverle Hobby, PA-C   25 mg at 12/28/13 2352  . magnesium hydroxide (MILK OF MAGNESIA) suspension 30 mL  30 mL Oral Daily PRN Laverle Hobby, PA-C      .  polyethylene glycol (MIRALAX / GLYCOLAX) packet 17 g  17 g Oral Daily Laverle Hobby, PA-C   17 g at 12/30/13 0800  . traZODone (DESYREL) tablet 50 mg  50 mg Oral QHS,MR X 1 Laverle Hobby, PA-C   50 mg at 12/29/13 2302    Lab Results:  Results for orders placed during the hospital encounter of 12/28/13 (from the past 48 hour(s))  ACETAMINOPHEN LEVEL     Status: None   Collection Time    12/28/13  6:05 PM      Result Value Ref Range   Acetaminophen (Tylenol), Serum <15.0  10 - 30 ug/mL   Comment:            THERAPEUTIC CONCENTRATIONS VARY     SIGNIFICANTLY. A RANGE OF 10-30     ug/mL MAY BE AN EFFECTIVE     CONCENTRATION FOR MANY  PATIENTS.     HOWEVER, SOME ARE BEST TREATED     AT CONCENTRATIONS OUTSIDE THIS     RANGE.     ACETAMINOPHEN CONCENTRATIONS     >150 ug/mL AT 4 HOURS AFTER     INGESTION AND >50 ug/mL AT 12     HOURS AFTER INGESTION ARE     OFTEN ASSOCIATED WITH TOXIC     REACTIONS.  CBC     Status: None   Collection Time    12/28/13  6:05 PM      Result Value Ref Range   WBC 4.1  4.0 - 10.5 K/uL   RBC 4.73  4.22 - 5.81 MIL/uL   Hemoglobin 14.9  13.0 - 17.0 g/dL   HCT 41.7  39.0 - 52.0 %   MCV 88.2  78.0 - 100.0 fL   MCH 31.5  26.0 - 34.0 pg   MCHC 35.7  30.0 - 36.0 g/dL   RDW 12.3  11.5 - 15.5 %   Platelets 317  150 - 400 K/uL  COMPREHENSIVE METABOLIC PANEL     Status: None   Collection Time    12/28/13  6:05 PM      Result Value Ref Range   Sodium 139  137 - 147 mEq/L   Potassium 3.8  3.7 - 5.3 mEq/L   Chloride 103  96 - 112 mEq/L   CO2 24  19 - 32 mEq/L   Glucose, Bld 99  70 - 99 mg/dL   BUN 15  6 - 23 mg/dL   Creatinine, Ser 0.92  0.50 - 1.35 mg/dL   Calcium 9.6  8.4 - 10.5 mg/dL   Total Protein 7.7  6.0 - 8.3 g/dL   Albumin 4.3  3.5 - 5.2 g/dL   AST 25  0 - 37 U/L   ALT 17  0 - 53 U/L   Alkaline Phosphatase 60  39 - 117 U/L   Total Bilirubin 0.6  0.3 - 1.2 mg/dL   GFR calc non Af Amer >90  >90 mL/min   GFR calc Af Amer >90  >90 mL/min   Comment: (NOTE)     The eGFR has been calculated using the CKD EPI equation.     This calculation has not been validated in all clinical situations.     eGFR's persistently <90 mL/min signify possible Chronic Kidney     Disease.  ETHANOL     Status: None   Collection Time    12/28/13  6:05 PM      Result Value Ref Range   Alcohol, Ethyl (B) <11  0 -  11 mg/dL   Comment:            LOWEST DETECTABLE LIMIT FOR     SERUM ALCOHOL IS 11 mg/dL     FOR MEDICAL PURPOSES ONLY  SALICYLATE LEVEL     Status: Abnormal   Collection Time    12/28/13  6:05 PM      Result Value Ref Range   Salicylate Lvl <9.5 (*) 2.8 - 20.0 mg/dL  URINE RAPID DRUG  SCREEN (HOSP PERFORMED)     Status: Abnormal   Collection Time    12/28/13  7:17 PM      Result Value Ref Range   Opiates NONE DETECTED  NONE DETECTED   Cocaine NONE DETECTED  NONE DETECTED   Benzodiazepines NONE DETECTED  NONE DETECTED   Amphetamines NONE DETECTED  NONE DETECTED   Tetrahydrocannabinol POSITIVE (*) NONE DETECTED   Barbiturates NONE DETECTED  NONE DETECTED   Comment:            DRUG SCREEN FOR MEDICAL PURPOSES     ONLY.  IF CONFIRMATION IS NEEDED     FOR ANY PURPOSE, NOTIFY LAB     WITHIN 5 DAYS.                LOWEST DETECTABLE LIMITS     FOR URINE DRUG SCREEN     Drug Class       Cutoff (ng/mL)     Amphetamine      1000     Barbiturate      200     Benzodiazepine   284     Tricyclics       132     Opiates          300     Cocaine          300     THC              50    Physical Findings: AIMS: Facial and Oral Movements Muscles of Facial Expression: None, normal Lips and Perioral Area: None, normal Jaw: None, normal Tongue: None, normal,Extremity Movements Upper (arms, wrists, hands, fingers): None, normal Lower (legs, knees, ankles, toes): None, normal, Trunk Movements Neck, shoulders, hips: None, normal, Overall Severity Severity of abnormal movements (highest score from questions above): None, normal Incapacitation due to abnormal movements: None, normal Patient's awareness of abnormal movements (rate only patient's report): No Awareness, Dental Status Current problems with teeth and/or dentures?: No Does patient usually wear dentures?: No  CIWA:    COWS:  COWS Total Score: 3  Treatment Plan Summary: Daily contact with patient to assess and evaluate symptoms and progress in treatment Medication management  Plan: 1. Continue crisis management and stabilization. 2. Medication management to reduce current symptoms to base line and improve the patient's overall level of functioning. Continue current medication management. 3. Treat health problems as  indicated. 4. Develop treatment plan to decrease risk of relapse upon discharge and to reduce the need for readmission. 5. Psycho-social education regarding relapse prevention and self care. 6. Health care follow up as needed for medical problems. 7. Restart home medications where appropriate. 8. Disposition plans are in progress.  Medical Decision Making Problem Points:  Established problem, stable/improving (1) Data Points:  Review or order medicine tests (1)  I certify that inpatient services furnished can reasonably be expected to improve the patient's condition.   Marlane Hatcher. [place] [facility] 12/30/2013 2:05 PM  Reviewed the information documented and agree with the treatment  planLisette Grinder R. 12/30/2013 6:17 PM

## 2013-12-30 NOTE — Progress Notes (Signed)
Pt observed sitting in the dayroom watching TV with minimal interaction with peers.  Pt reports he is doing ok this evening.  HE says he is feeling better, but does not feel he is ready for discharge.  He says he has been going to groups.  He denies SI/HI/AV at this time.  Pt says he plans to return home when discharged.  Pt makes his needs known to staff.  Support and encouragement offered.  Safety maintained with q15 minute checks.

## 2013-12-30 NOTE — Tx Team (Signed)
Interdisciplinary Treatment Plan Update (Adult)  Date: 12/30/2013  Time Reviewed:  9:45 AM  Progress in Treatment: Attending groups: Yes Participating in groups:  Yes Taking medication as prescribed:  Yes Tolerating medication:  Yes Family/Significant othe contact made: No, pt refused Patient understands diagnosis:  Yes Discussing patient identified problems/goals with staff:  Yes Medical problems stabilized or resolved:  Yes Denies suicidal/homicidal ideation: Yes Issues/concerns per patient self-inventory:  Yes Other:  New problem(s) identified: N/A  Discharge Plan or Barriers: Pt has follow up scheduled at [facility] for outpatient medication management.    Reason for Continuation of Hospitalization: Anxiety Depression Medication Stabilization  Comments: N/A  Estimated length of stay: 3-4 days  For review of initial/current patient goals, please see plan of care.  Attendees: Patient:     Family:     Physician:  Dr. Javier GlazierJohnalagadda 12/30/2013 10:28 AM   Nursing:   Harold Barbanonecia Byrd, RN 12/30/2013 10:28 AM   Clinical Social Worker:  Reyes Ivanhelsea Horton, LCSW 12/30/2013 10:28 AM   Other: Lamount Crankerhris Judge, RN 12/30/2013 10:28 AM   Other:  Sherrye PayorValerie Noch, care coordination 12/30/2013 10:28 AM   Other:  Onnie BoerJennifer Clark, case manager 12/30/2013 10:28 AM   Other:     Other:    Other:    Other:    Other:    Other:    Other:     Scribe for Treatment Team:   Carmina MillerHorton, Choya Tornow Nicole, 12/30/2013 10:28 AM

## 2013-12-30 NOTE — [facility] Group Notes (Signed)
[facility] LCSW Group Therapy  12/30/2013  1:15 PM   Type of Therapy:  Group Therapy  Participation Level:  Active  Participation Quality:  Attentive, Sharing and Supportive  Affect:  Depressed and Flat  Cognitive:  Alert and Oriented  Insight:  Developing/Improving and Engaged  Engagement in Therapy:  Developing/Improving and Engaged  Modes of Intervention:  Clarification, Confrontation, Discussion, Education, Exploration, Limit-setting, Orientation, Problem-solving, Rapport Building, Dance movement psychotherapisteality Testing, Socialization and Support  Summary of Progress/Problems: The topic for today was feelings about relapse.  Pt discussed what relapse prevention is to them and identified triggers that they are on the path to relapse.  Pt processed their feeling towards relapse and was able to relate to peers.  Pt discussed coping skills that can be used for relapse prevention.  Pt shared that relapse for him is not being able to handle his emotions.  Pt states that he feels he will work harder on managing his emotions and staying positive.  Pt actively participated and was engaged in group discussion.    Reyes IvanChelsea Horton, LCSW 12/30/2013  3:12 PM

## 2013-12-30 NOTE — Progress Notes (Signed)
D: Patient cooperative with staff and peers. Patient has appropriate affect and anxious mood. Declined self inventory sheet today. He's attending groups, visible in the milieu and interacting with peers on the unit. Patient is compliant with current medications.  A: Support and encouragement provided to patient. Scheduled medications administered per MD orders. Maintain Q15 minute checks for safety.  R: Patient receptive. Denies SI/HI and auditory/visual hallucinations. Patient remains safe.

## 2013-12-31 DIAGNOSIS — X838XXA Intentional self-harm by other specified means, initial encounter: Secondary | ICD-10-CM

## 2013-12-31 NOTE — Progress Notes (Signed)
D) Pt has attended the groups today. This morning stated he did not want to partisipate and then later was willing to. Rates his depression at a 4 and his hopelessness as a 5. Writes that he has thoughts of SI on and off. Affect and behavior is incongruent with this. Pt sits in the dayroom and interacts with a male peer laughing and joking. When filling out his inventory today was laughing and wrote that he wanted a pizza party while stating that he was having thoughts of SI on and off. A) Encouraged Pt to talk and share his feelings. Therapeutic humor used with Pt. Given support and reassurance as needed. Praised appropriately. R) Pt reports thoughts of SI and and off. Verbal agreement for Pt's safety while on the unit obtained.

## 2013-12-31 NOTE — Progress Notes (Signed)
Patient ID: Jay Stevenson, male   DOB: [birth year], 23 y.o.   [id number] [facility]  12/31/2013 3:44 PM Jay Stevenson  MRN:  [id number] Subjective:  Patient states he is doing well but had trouble sleeping last night. He felt that the sleeping pill was given too early.  Mood is good, and he feels that his symptoms are getting better as the day is going on.   Objective:  Patient is up and active on the unit. He is going to every group today. His affect is positive, mood is stable affect is congruent. Diagnosis:   DSM5: Schizophrenia Disorders:   Obsessive-Compulsive Disorders:   Trauma-Stressor Disorders:   Substance/Addictive Disorders:   Depressive Disorders:  MDD recurrent severe w/o psychotic features Total Time spent with patient: 20 minutes  Axis I  MDD recurrent severe w/o psychotic features, suicide attempt.  ADL's:  Intact  Sleep: Good  Appetite:  Fair  Suicidal Ideation:  Decreasing, no intent, no means, no plan and can contract for safety. Homicidal Ideation:  denies AEB (as evidenced by):  Psychiatric Specialty Exam: Physical Exam  ROS  Blood pressure 117/66, pulse 99, temperature 97.6 F (36.4 C), temperature source Oral, resp. rate 18, height 5' 9.75" (1.772 m), weight 80.287 kg (177 lb), SpO2 98.00%.Body mass index is 25.57 kg/(m^2).  General Appearance: Disheveled  Eye Contact::  Good  Speech:  Clear and Coherent  Volume:  Normal  Mood:  Depressed  Affect:  Congruent  Thought Process:  Goal Directed  Orientation:  Full (Time, Place, and Person)  Thought Content:  WDL  Suicidal Thoughts:  Yes.  without intent/plan  Homicidal Thoughts:  No  Memory:  NA  Judgement:  Poor  Insight:  Shallow  Psychomotor Activity:  Normal  Concentration:  Fair  Recall:  Fair  Fund of Knowledge:Fair  Language: Good  Akathisia:  No  Handed:  Right  AIMS (if indicated):     Assets:  Communication Skills Desire for Improvement Housing Physical  Health Resilience  Sleep:  Number of Hours: 5.5   Musculoskeletal: Strength & Muscle Tone: within normal limits Gait & Station: normal Patient leans: N/A  Current Medications: Current Facility-Administered Medications  Medication Dose Route Frequency Provider Last Rate Last Dose  . acetaminophen (TYLENOL) tablet 650 mg  650 mg Oral Q6H PRN Kerry HoughSpencer E Simon, PA-C      . alum & mag hydroxide-simeth (MAALOX/MYLANTA) 200-200-20 MG/5ML suspension 30 mL  30 mL Oral Q4H PRN Kerry HoughSpencer E Simon, PA-C      . busPIRone (BUSPAR) tablet 7.5 mg  7.5 mg Oral BID Beau FannyJohn C Withrow, FNP   7.5 mg at 12/31/13 0848  . feeding supplement (ENSURE COMPLETE) (ENSURE COMPLETE) liquid 237 mL  237 mL Oral TID BM Lavena BullionHeather W Baron, RD   237 mL at 12/31/13 1420  . FLUoxetine (PROZAC) capsule 10 mg  10 mg Oral Daily Beau FannyJohn C Withrow, FNP   10 mg at 12/30/13 [id number]  . hydrOXYzine (ATARAX/VISTARIL) tablet 25 mg  25 mg Oral Q6H PRN Kerry HoughSpencer E Simon, PA-C   25 mg at 12/28/13 2352  . magnesium hydroxide (MILK OF MAGNESIA) suspension 30 mL  30 mL Oral Daily PRN Kerry HoughSpencer E Simon, PA-C      . polyethylene glycol (MIRALAX / GLYCOLAX) packet 17 g  17 g Oral Daily Kerry HoughSpencer E Simon, PA-C   17 g at 12/31/13 0846  . traZODone (DESYREL) tablet 50 mg  50 mg Oral QHS,MR X 1 Kerry HoughSpencer E Simon, PA-C  50 mg at 12/30/13 2203    Lab Results:  No results found for this or any previous visit (from the past 48 hour(s)).  Physical Findings: AIMS: Facial and Oral Movements Muscles of Facial Expression: None, normal Lips and Perioral Area: None, normal Jaw: None, normal Tongue: None, normal,Extremity Movements Upper (arms, wrists, hands, fingers): None, normal Lower (legs, knees, ankles, toes): None, normal, Trunk Movements Neck, shoulders, hips: None, normal, Overall Severity Severity of abnormal movements (highest score from questions above): None, normal Incapacitation due to abnormal movements: None, normal Patient's awareness of abnormal movements  (rate only patient's report): No Awareness, Dental Status Current problems with teeth and/or dentures?: No Does patient usually wear dentures?: No  CIWA:    COWS:  COWS Total Score: 3  Treatment Plan Summary: Daily contact with patient to assess and evaluate symptoms and progress in treatment Medication management  Plan: 1. Continue crisis management and stabilization. 2. Medication management to reduce current symptoms to base line and improve the patient's overall level of functioning. Continue current medication management. 3. Treat health problems as indicated. 4. Develop treatment plan to decrease risk of relapse upon discharge and to reduce the need for readmission. 5. Psycho-social education regarding relapse prevention and self care. 6. Health care follow up as needed for medical problems. 7. Restart home medications where appropriate. 8. Disposition plans are in progress. 9. Patient is encouraged to take trazodone closer to his bed time 11pm and then if not asleep at 12 he may repeat it. Medical Decision Making Problem Points:  Established problem, stable/improving (1) Data Points:  Review or order medicine tests (1)  I certify that inpatient services furnished can reasonably be expected to improve the patient's condition.   Rona Ravens. Mashburn [facility] 12/31/2013 3:44 PM  Agree with assessment and plan Madie Reno A. Dub Mikes, M.D.

## 2013-12-31 NOTE — Progress Notes (Signed)
[facility] Group Notes:  (Nursing/MHT/Case Management/Adjunct)  Date:  12/31/2013  Time:  4:25 PM  Type of Therapy:  Psychoeducational Skills  Participation Level:  Active  Participation Quality:  Appropriate, Attentive and Sharing  Affect:  Appropriate  Cognitive:  Appropriate  Insight:  Appropriate  Engagement in Group:  Engaged  Modes of Intervention:  Activity  Summary of Progress/Problems: Pt stated one thing he has learned while at the hospital is that there is support out there and he just needs to ask for it.  Jay Stevenson, Jay Stevenson C 12/31/2013, 4:25 PM

## 2013-12-31 NOTE — Progress Notes (Signed)
Psychoeducational Group Note  Date:  12/31/2013 Time: 1015  Group Topic/Focus:  Identifying Needs:   The focus of this group is to help patients identify their personal needs that have been historically problematic and identify healthy behaviors to address their needs.  Participation Level:  Active  Participation Quality:  Appropriate  Affect:  Appropriate  Cognitive:  Alert  Insight:  Engaged  Engagement in Group:  Engaged  Additional Comments:    12/31/2013,1:28 PM Aneth Schlagel, Joie BimlerPatricia Lynn

## 2013-12-31 NOTE — Progress Notes (Signed)
[facility] Group Notes:  (Nursing/MHT/Case Management/Adjunct)  Date:  12/31/2013  Time:  10:52 PM  Type of Therapy:  Group Therapy  Participation Level:  Minimal  Participation Quality:  Appropriate  Affect:  Appropriate  Cognitive:  Appropriate  Insight:  Good  Engagement in Group:  Developing/Improving  Modes of Intervention:  Socialization and Support  Summary of Progress/Problems: Pt. Rated his energy level 8.  Pt. Stated hiking and rock climbing is a healthy coping skill.  Sondra ComeWilson, Ladena Jacquez J 12/31/2013, 10:52 PM

## 2013-12-31 NOTE — Progress Notes (Signed)
D.  Pt up and active in milieu.  Positive for evening wrap up group, no complaints voiced.  Denies SI/HI/hallucinations at this time.  Interacting appropriately with peers on unit this shift.  A.  Support and encouragement offered  R.  Pt remains safe on unit, will continue to monitor.

## 2013-12-31 NOTE — [facility] Group Notes (Signed)
[facility] LCSW Group Therapy  12/31/2013 3:53 PM  Type of Therapy:  Group Therapy  Participation Level:  Minimal  Participation Quality:  Appropriate and Resistant  Affect:  Irritable  Cognitive:  Alert, Appropriate and Oriented  Insight:  Developing/Improving and Limited  Engagement in Therapy:  Lacking and Supportive  Modes of Intervention:  Discussion, Education, Exploration, Rapport Building, Socialization and Support  Summary of Progress/Problems: Pt was able to introduce himself and was able to identify a coping skill.  Pt did not participate in group discussion and was irritable with staff when his name was pronounced incorrectly.   Seabron SpatesVaughn, Oline Belk Anne 12/31/2013, 3:53 PM

## 2014-01-01 NOTE — Progress Notes (Signed)
D.  Pt pleasant on approach, appears younger than stated age.  Denies complaints other than that he would like the doctor to consider melatonin rather than Trazodone in the future for sleep.  Up and active in the milieu, positive for evening wrap up group.  Pt has been doing exercises in the hall and states that he wishes to gain back the weight in muscle that he has lost recently.  A.  Support and encouragement offered  R.  Pt remains safe on unit, will continue to monitor.

## 2014-01-01 NOTE — Progress Notes (Signed)
Patient ID: Jay Stevenson Goehring, male   DOB: 05-14-1991, 23 y.o.   MRN: [id number] [facility]  01/01/2014 1:00 PM Jay Stevenson  MRN:  [id number] Subjective:  Patient states he slept well last night, reports he is doing "pretty good." Reports decrease in anxiety and depression. Says he feels better as the day continues.   Objective:  Patient is up and active on the unit. He is going to every group today. His affect is positive, mood is stable affect is congruent.  Patient endorses feeling 40-50% better than upon arrival and that he is making some progress. He says he would feel better if he could talk to the nurses individually as problems arise instead of in a group.   Diagnosis:   DSM5: Schizophrenia Disorders:   Obsessive-Compulsive Disorders:   Trauma-Stressor Disorders:   Substance/Addictive Disorders:   Depressive Disorders:  MDD recurrent severe w/o psychotic features Total Time spent with patient: 20 minutes  Axis I  MDD recurrent severe w/o psychotic features, suicide attempt.  ADL's:  Intact  Sleep: Good  Appetite:  Fair  Suicidal Ideation:  Decreasing, no intent, no means, no plan and can contract for safety. Homicidal Ideation:  denies AEB (as evidenced by):  Psychiatric Specialty Exam: Physical Exam  ROS  Blood pressure 89/53, pulse 108, temperature 97.5 F (36.4 C), temperature source Oral, resp. rate 16, height 5' 9.75" (1.772 m), weight 80.287 kg (177 lb), SpO2 98.00%.Body mass index is 25.57 kg/(m^2).  General Appearance: Disheveled  Eye Contact::  Good  Speech:  Clear and Coherent  Volume:  Normal  Mood:  Depressed  Affect:  Congruent  Thought Process:  Goal Directed  Orientation:  Full (Time, Place, and Person)  Thought Content:  WDL  Suicidal Thoughts:  denies  Homicidal Thoughts:  No  Memory:  NA  Judgement:  Poor  Insight:  Shallow  Psychomotor Activity:  Normal  Concentration:  Fair  Recall:  Fair  Fund of Knowledge:Fair  Language: Good   Akathisia:  No  Handed:  Right  AIMS (if indicated):     Assets:  Communication Skills Desire for Improvement Housing Physical Health Resilience  Sleep:  Number of Hours: 5.5   Musculoskeletal: Strength & Muscle Tone: within normal limits Gait & Station: normal Patient leans: N/A  Current Medications: Current Facility-Administered Medications  Medication Dose Route Frequency Provider Last Rate Last Dose  . acetaminophen (TYLENOL) tablet 650 mg  650 mg Oral Q6H PRN Kerry HoughSpencer E Simon, PA-C      . alum & mag hydroxide-simeth (MAALOX/MYLANTA) 200-200-20 MG/5ML suspension 30 mL  30 mL Oral Q4H PRN Kerry HoughSpencer E Simon, PA-C      . busPIRone (BUSPAR) tablet 7.5 mg  7.5 mg Oral BID Beau FannyJohn C Withrow, FNP   7.5 mg at 12/31/13 0848  . feeding supplement (ENSURE COMPLETE) (ENSURE COMPLETE) liquid 237 mL  237 mL Oral TID BM Lavena BullionHeather W Baron, RD   237 mL at 12/31/13 1420  . FLUoxetine (PROZAC) capsule 10 mg  10 mg Oral Daily Beau FannyJohn C Withrow, FNP   10 mg at 12/30/13 [id number]  . hydrOXYzine (ATARAX/VISTARIL) tablet 25 mg  25 mg Oral Q6H PRN Kerry HoughSpencer E Simon, PA-C   25 mg at 12/28/13 2352  . magnesium hydroxide (MILK OF MAGNESIA) suspension 30 mL  30 mL Oral Daily PRN Kerry HoughSpencer E Simon, PA-C      . polyethylene glycol (MIRALAX / GLYCOLAX) packet 17 g  17 g Oral Daily Kerry HoughSpencer E Simon, PA-C   17 g  at 12/31/13 0846  . traZODone (DESYREL) tablet 50 mg  50 mg Oral QHS,MR X 1 Kerry Hough, PA-C   50 mg at 12/30/13 2203    Lab Results:  No results found for this or any previous visit (from the past 48 hour(s)).  Physical Findings: AIMS: Facial and Oral Movements Muscles of Facial Expression: None, normal Lips and Perioral Area: None, normal Jaw: None, normal Tongue: None, normal,Extremity Movements Upper (arms, wrists, hands, fingers): None, normal Lower (legs, knees, ankles, toes): None, normal, Trunk Movements Neck, shoulders, hips: None, normal, Overall Severity Severity of abnormal movements (highest score  from questions above): None, normal Incapacitation due to abnormal movements: None, normal Patient's awareness of abnormal movements (rate only patient's report): No Awareness, Dental Status Current problems with teeth and/or dentures?: No Does patient usually wear dentures?: No  CIWA:    COWS:  COWS Total Score: 3  Treatment Plan Summary: Daily contact with patient to assess and evaluate symptoms and progress in treatment Medication management  Plan: 1. Continue crisis management and stabilization. 2. Medication management to reduce current symptoms to base line and improve the patient's overall level of functioning. Continue current medication management. 3. Treat health problems as indicated. 4. Develop treatment plan to decrease risk of relapse upon discharge and to reduce the need for readmission. 5. Psycho-social education regarding relapse prevention and self care. 6. Health care follow up as needed for medical problems. 7. Restart home medications where appropriate. 8. Disposition plans are in progress. 9. Patient is encouraged to take trazodone closer to his bed time 11pm and then if not asleep at 12 he may repeat it. 10. Disposition is in progress. Medical Decision Making Problem Points:  Established problem, stable/improving (1) Data Points:  Review or order medicine tests (1)  I certify that inpatient services furnished can reasonably be expected to improve the patient's condition.   Rona Ravens. Mashburn [facility] 01/01/2014 1:00 PM  Agree with assessment and plan Madie Reno A. Dub Mikes, M.D.

## 2014-01-01 NOTE — Progress Notes (Signed)
Psychoeducational Group Note  Date: 01/01/2014 Time:  0930  Group Topic/Focus:  Gratefulness:  The focus of this group is to help patients identify what two things they are most grateful for in their lives. What helps ground them and to center them on their work to their recovery.  Participation Level:  Active  Participation Quality:  Appropriate  Affect:  Appropriate  Cognitive:  Oriented  Insight:  Improving  Engagement in Group:  Engaged  Additional Comments:    Jay Stevenson   

## 2014-01-01 NOTE — Progress Notes (Signed)
Psychoeducational Group Note  Date:  01/01/2014 Time:  1015  Group Topic/Focus:  Making Healthy Choices:   The focus of this group is to help patients identify negative/unhealthy choices they were using prior to admission and identify positive/healthier coping strategies to replace them upon discharge.  Participation Level:  Active  Participation Quality:  Appropriate  Affect:  Appropriate  Cognitive:  Appropriate  Insight:  Improving  Engagement in Group:  Engaged  Additional Comments:    Daequan Kozma A 01/01/2014  

## 2014-01-01 NOTE — Progress Notes (Signed)
Adult Psychoeducational Group Note  Date:  01/01/2014 Time:  11:26 PM  Group Topic/Focus:  Wrap-Up Group:   The focus of this group is to help patients review their daily goal of treatment and discuss progress on daily workbooks.  Participation Level:  Minimal  Participation Quality:  Appropriate, Attentive and Supportive  Affect:  Appropriate  Cognitive:  Alert and Appropriate  Insight: Appropriate and Good  Engagement in Group:  Engaged  Modes of Intervention:  Discussion  Additional Comments: Pt attended and participated in group where pts were asked to tell one good thing and one new thing in their life. One good thing pt likes about himself: Pt stated that his interests/activities keep him engaged and involved in life.  Pts one good thing about life: Pt stated he is getting his motivation back to work out again.   Dalia HeadingSeeley, Arasely Akkerman Aileen 01/01/2014, 11:26 PM

## 2014-01-01 NOTE — Progress Notes (Signed)
D) Pt rates his depression at a 7 and his hopelessness at a 4. Denies SI and HI. Attends groups and will often keep his eyes averted away from the speaker. Gets up and will leave the group and come back a couple of times during a group. Interacts with select peers and will joke around. Pt states he feels that he is doing better overall. A) Given support, reassurance and praise along with encouragement. Encouraged to work on his packet during the shift. R) Denies SI and HI.

## 2014-01-02 MED ORDER — TRAZODONE HCL 50 MG PO TABS: ORAL_TABLET | ORAL | Status: DC

## 2014-01-02 MED ORDER — TRAZODONE HCL 50 MG PO TABS
50.0000 mg | ORAL_TABLET | Freq: Every day | ORAL | Status: DC
  Filled 2014-01-02: qty 3

## 2014-01-02 MED ORDER — FLUOXETINE HCL 10 MG PO CAPS: 10.0000 mg | ORAL_CAPSULE | Freq: Every day | ORAL | Status: DC

## 2014-01-02 MED ORDER — BUSPIRONE HCL 7.5 MG PO TABS: 7.5000 mg | ORAL_TABLET | Freq: Two times a day (BID) | ORAL | Status: DC

## 2014-01-02 NOTE — [facility] Suicide Risk Assessment (Signed)
   Demographic Factors:  Male, Adolescent or young adult, Caucasian, Low socioeconomic status and Unemployed  Total Time spent with patient: 30 minutes  Psychiatric Specialty Exam: Physical Exam  ROS  Blood pressure 129/77, pulse 87, temperature 97.6 F (36.4 C), temperature source Oral, resp. rate 18, height 5' 9.75" (1.772 m), weight 80.287 kg (177 lb), SpO2 98.00%.Body mass index is 25.57 kg/(m^2).  General Appearance: Casual  Eye Contact::  Good  Speech:  Clear and Coherent  Volume:  Normal  Mood:  Euthymic  Affect:  Appropriate and Congruent  Thought Process:  Coherent and Goal Directed  Orientation:  Full (Time, Place, and Person)  Thought Content:  WDL  Suicidal Thoughts:  No  Homicidal Thoughts:  No  Memory:  Immediate;   Fair  Judgement:  Intact  Insight:  Fair  Psychomotor Activity:  Normal  Concentration:  Good  Recall:  Good  Fund of Knowledge:Good  Language: Good  Akathisia:  NA  Handed:  Right  AIMS (if indicated):     Assets:  Communication Skills Desire for Improvement Housing Leisure Time Physical Health Resilience Social Support Talents/Skills Transportation  Sleep:  Number of Hours: 5.5    Musculoskeletal: Strength & Muscle Tone: within normal limits Gait & Station: normal Patient leans: N/A   Mental Status Per Nursing Assessment::   On Admission:  Suicidal ideation indicated by patient  Current Mental Status by Physician: NA  Loss Factors: Decrease in vocational status, Loss of significant relationship and Financial problems/change in socioeconomic status  Historical Factors: Impulsivity  Risk Reduction Factors:   Sense of responsibility to family, Religious beliefs about death, Living with another person, especially a relative, Positive social support, Positive therapeutic relationship and Positive coping skills or problem solving skills  Continued Clinical Symptoms:  Depression:   Recent sense of  peace/wellbeing Alcohol/Substance Abuse/Dependencies Previous Psychiatric Diagnoses and Treatments Medical Diagnoses and Treatments/Surgeries  Cognitive Features That Contribute To Risk:  Polarized thinking    Suicide Risk:  Minimal: No identifiable suicidal ideation.  Patients presenting with no risk factors but with morbid ruminations; may be classified as minimal risk based on the severity of the depressive symptoms  Discharge Diagnoses:   AXIS I:  Generalized Anxiety Disorder, Major Depression, Recurrent severe and Substance Abuse AXIS II:  Deferred AXIS III:   Past Medical History  Diagnosis Date  . Mood disorder     anger management  . Polysubstance abuse   . Alcohol abuse   . H/O: substance abuse   . Panic attack   . Seasonal allergies   . Irritable bowel syndrome 06/27/2013  . IBS (irritable bowel syndrome)    AXIS IV:  other psychosocial or environmental problems, problems related to social environment and problems with primary support group AXIS V:  61-70 mild symptoms  Plan Of Care/Follow-up recommendations:  Activity:  As tolerated Diet:  Regular  Is patient on multiple antipsychotic therapies at discharge:  No   Has Patient had three or more failed trials of antipsychotic monotherapy by history:  No  Recommended Plan for Multiple Antipsychotic Therapies: NA    Magalie Almon,JANARDHAHA R. 01/02/2014, 12:36 PM

## 2014-01-02 NOTE — [facility] Group Notes (Signed)
[facility]   01/02/2014 8:45 AM  Participation Quality:  Alert, Appropriate and Oriented  Mood/Affect:  Calm  Depression Rating:  0  Anxiety Rating:  0  Thoughts of Suicide:  Pt denies SI/HI  Will you contract for safety?   Yes  Current AVH:  Pt denies  Plan for Discharge/Comments:  Pt attended discharge planning group and actively participated in group.  CSW provided pt with today's workbook.  Pt reports feeling well today and ready to d/c.  Pt reports a plan to make more positive friends.  [place] in [place] and has follow up scheduled at [facility] for outpatient medication management.  No further needs voiced by pt at this time.    Transportation Means: Pt reports access to transportation - states he can walk home from here  Supports: No supports mentioned at this time  Reyes IvanChelsea Horton, LCSW 01/02/2014 10:06 AM

## 2014-01-02 NOTE — Progress Notes (Signed)
D:  Patient's self inventory sheet, patient has fair sleep, good appetite, normal energy level, good attention span.  Rated depression and hopeless 1, denied anxiety.  Denied withdrawals.  Denied SI.  Worst pain #1 in past 24 hours.  "Eat better, exercise and cut people off.  Build new friendships.  No problems taking meds after discharge. A:  Medications administered per MD orders.  Emotional support and encouragement given patient. R:  Denied SI and HI.  Denied A/V hallucinations.  Will continue to monitor patient for safety with 15 minute checks.  Safety maintained.

## 2014-01-02 NOTE — Discharge Summary (Signed)
Physician Discharge Summary Note  Patient:  Jay Stevenson is an 23 y.o., male MRN:  [id number] DOB:  04-Sep-1991 Patient phone:  [phone] (home)  Patient address:   [address] [place] [address],  Total Time spent with patient: 20 minutes  Date of Admission:  12/28/2013 Date of Discharge: 01/02/2014  Reason for Admission:  Suicide attempt  Discharge Diagnoses: Principal Problem:   MDD (major depressive disorder)   Psychiatric Specialty Exam:  Please see D/C SRA Physical Exam  ROS  Blood pressure 129/77, pulse 87, temperature 97.6 F (36.4 C), temperature source Oral, resp. rate 18, height 5' 9.75" (1.772 m), weight 80.287 kg (177 lb), SpO2 98.00%.Body mass index is 25.57 kg/(m^2).  General Appearance:   Eye Contact::    Speech:    Volume:    Mood:    Affect:    Thought Process:    Orientation:    Thought Content:    Suicidal Thoughts:    Homicidal Thoughts:    Memory:    Judgement:    Insight:    Psychomotor Activity:    Concentration:    Recall:    Fund of Knowledge:  Language:   Akathisia:    Handed:    AIMS (if indicated):     Assets:    Sleep:  Number of Hours: 5.5  Musculoskeletal: Strength & Muscle Tone:  Gait & Station:  Patient leans:   DSM5:  Schizophrenia Disorders:   Obsessive-Compulsive Disorders:   Trauma-Stressor Disorders:   Substance/Addictive Disorders:   Depressive Disorders:  MDD recurrent severe Discharge Diagnoses:    AXIS I: Major Depression, Recurrent severe and Substance Abuse, Generalized Anxiety disorder AXIS II: Deferred  AXIS III:  Past Medical History   Diagnosis  Date   .  Mood disorder      anger management   .  Polysubstance abuse    .  Alcohol abuse    .  H/O: substance abuse    .  Panic attack    .  Seasonal allergies    .  Irritable bowel syndrome  06/27/2013   .  IBS (irritable bowel syndrome)     AXIS IV: other psychosocial or environmental problems, problems related to social environment and  problems with primary support group  AXIS V: 61-70 mild symptoms   Level of Care:  OP  Hospital Course:       Pt. Is a 23 yo male, involuntarily committed due cutting wrists in a suicide attempt. According to assessment note pt admits to calling a friend and starting a text argument with ex-GF, believing that the conflict would help him work up the nerve to fatality cut himself. One of the friends called the police, when they went to his home he was found in the act of cutting himself. He refused to give up the knife and was tased.    For Past, Social, Family, and psychiatric history please see H&P.     Upon arrival at the unit Jay Stevenson was evaluated by a psychiatrist and his symptoms were documented. He endorsed suicidal ideation and attempt, psychomotor agitation, fatigue, feelings of worthlessness and guilt, hopelessness, anxiety, loss of energy, insomnia, and weight loss.         Medication management was initiated with trazodone 50mg  for sleep at hs, Vistaril 25mg  q 6 hours prn anxiety, buspar 7.5 mg po BID for anxiety, prozac 10mg  for depressive symptoms.       He was encouraged to participate in the unit programming and did so  willingly.  He was interactive with his peers and his behavior was also appropriate. It was often incongruent with his reported levels of depression to the staff and providers. He seemed to maximize his symptoms on report, and stated that he was afraid he would [facility] "[facility] was ready to go."       Jay ClimesStiles did not require a 1:1, seclusion or restraint. He responded well to the medication and had no side effects. By the day of his discharge he was in much improved condition and was motivated to return home and follow up as noted below.  He denied SI/HI or AVH.   Jay ClimesStiles was to follow up to have his stitches removed as instructed in the ED.   Consults:  None  Significant Diagnostic Studies:  None  Discharge Vitals:   Blood pressure 129/77, pulse 87,  temperature 97.6 F (36.4 C), temperature source Oral, resp. rate 18, height 5' 9.75" (1.772 m), weight 80.287 kg (177 lb), SpO2 98.00%. Body mass index is 25.57 kg/(m^2). Lab Results:   No results found for this or any previous visit (from the past 72 hour(s)).  Physical Findings: AIMS: Facial and Oral Movements Muscles of Facial Expression: None, normal Lips and Perioral Area: None, normal Jaw: None, normal Tongue: None, normal,Extremity Movements Upper (arms, wrists, hands, fingers): None, normal Lower (legs, knees, ankles, toes): None, normal, Trunk Movements Neck, shoulders, hips: None, normal, Overall Severity Severity of abnormal movements (highest score from questions above): None, normal Incapacitation due to abnormal movements: None, normal Patient's awareness of abnormal movements (rate only patient's report): No Awareness, Dental Status Current problems with teeth and/or dentures?: No Does patient usually wear dentures?: No  CIWA:  CIWA-Ar Total: 1 COWS:  COWS Total Score: 2  Psychiatric Specialty Exam: See Psychiatric Specialty Exam and Suicide Risk Assessment completed by Attending Physician prior to discharge.  Discharge destination:  Home  Is patient on multiple antipsychotic therapies at discharge:  No   Has Patient had three or more failed trials of antipsychotic monotherapy by history:  No  Recommended Plan for Multiple Antipsychotic Therapies: NA  Discharge Orders   Future Orders Complete By Expires   Diet - low sodium heart healthy  As directed    Discharge instructions  As directed    Comments:     Take all of your medications as directed. Be sure to keep all of your follow up appointments.  If you are unable to keep your follow up appointment, call your Doctor's office to let them know, and reschedule.  Make sure that you have enough medication to last until your appointment. Be sure to get plenty of rest. Going to bed at the same time each night will  help. Try to avoid sleeping during the day.  Increase your activity as tolerated. Regular exercise will help you to sleep better and improve your mental health. Eating a heart healthy diet is recommended. Try to avoid salty or fried foods. Be sure to avoid all alcohol and illegal drugs.   Increase activity slowly  As directed        Medication List       Indication   busPIRone 7.5 MG tablet  Commonly known as:  BUSPAR  Take 1 tablet (7.5 mg total) by mouth 2 (two) times daily. For anxiety symptoms.   Indication:  Anxiety Disorder     FLUoxetine 10 MG capsule  Commonly known as:  PROZAC  Take 1 capsule (10 mg total) by  mouth daily. For depression.   Indication:  Depression     traZODone 50 MG tablet  Commonly known as:  DESYREL  Take one tablet at bedtime for insomnia.   Indication:  Trouble Sleeping           Follow-up Information   Follow up with Dr. Madaline Guthrie - [facility] On 01/24/2014. (You are scheduled with Dr. August Saucer on Tuesday, January 24, 2013 at 4:00 PM)    Contact information:   [address], [address] [place], [place] [address] Phone: [phone] Fax: [phone]      Follow-up recommendations:   Activities: Resume activity as tolerated. Diet: Heart healthy low sodium diet Tests: Follow up testing will be determined by your out patient provider. Comments:    Total Discharge Time:  Less than 30 minutes.  Signed: MASHBURN,NEIL 01/02/2014, 11:26 AM  Patient was seen face-to-face for psychiatric evaluation, suicide risk assessment, case discussed with her treatment team and physician extender and formulated appropriate disposition plan. Reviewed the information documented and agree with the treatment plan.   Randal Buba Jay Stevenson 01/05/2014 7:15 PM

## 2014-01-02 NOTE — Tx Team (Signed)
Interdisciplinary Treatment Plan Update (Adult)  Date: 01/02/2014  Time Reviewed:  9:45 AM  Progress in Treatment: Attending groups: Yes Participating in groups:  Yes Taking medication as prescribed:  Yes Tolerating medication:  Yes Family/Significant othe contact made: No, pt refused Patient understands diagnosis:  Yes Discussing patient identified problems/goals with staff:  Yes Medical problems stabilized or resolved:  Yes Denies suicidal/homicidal ideation: Yes Issues/concerns per patient self-inventory:  Yes Other:  New problem(s) identified: N/A  Discharge Plan or Barriers: Pt will follow up at [facility] for outpatient medication management.   Reason for Continuation of Hospitalization: Stable to d/c today  Comments: N/A  Estimated length of stay: D/C today  For review of initial/current patient goals, please see plan of care.  Attendees: Patient:  Jay Stevenson  01/02/2014 10:17 AM   Family:     Physician:  Dr. Javier GlazierJohnalagadda 01/02/2014 10:17 AM   Nursing:   Omelia BlackwaterNoelle Ardley, RN 01/02/2014 10:17 AM   Clinical Social Worker:  Reyes Ivanhelsea Horton, LCSW 01/02/2014 10:17 AM   Other: Verne SpurrNeil Mashburn, PA 01/02/2014 10:17 AM   Other:  Quintella ReichertBeverly Knight, RN 01/02/2014 10:17 AM   Other:  Neill Loftarol Davis, RN 01/02/2014 10:17 AM   Other:  Issac, PA studen 01/02/2014 10:18 AM   Other:    Other:    Other:    Other:    Other:      Scribe for Treatment Team:   Carmina MillerHorton, Rockie Vawter Nicole, 01/02/2014 , 10:17 AM

## 2014-01-02 NOTE — Clinical Social Work Note (Signed)
CSW retrieved a voicemail message off of coworker's voicemail box, Horace PorteousQuylle, as she was off today.  CSW received a voicemail from pt's mother, asking to talk to someone about concerns.  Pt refused consent for CSW to talk to anyone during this hospitalization.  CSW spoke with pt at this time to ask again and pt continued to refuse.  Pt states that mother's only concerns would be for a Child psychotherapistsocial worker to help him get a job.    Reyes IvanChelsea Horton, LCSW 01/02/2014  1:29 PM

## 2014-01-02 NOTE — Progress Notes (Signed)
[facility] :  Will you be returning to the same living situation after discharge: Yes,  returning home At discharge, do you have transportation home?:Yes,  states he can walk home, lives close by Do you have the ability to pay for your medications:Yes,  provided pt with prescriptions and pt verbalizes ability to afford meds  Release of information consent forms completed and in the chart;  Patient's signature needed at discharge.  Patient to Follow up at: Follow-up Information   Follow up with Dr. Madaline GuthrieSteiner - [facility] On 01/24/2014. (You are scheduled with Dr. August SaucerStenier on Tuesday, January 24, 2013 at 4:00 PM)    Contact information:   [address], [address] [place], [place] [address] Phone: [phone] Fax: [phone]      Patient denies SI/HI:   Yes,  denies SI/HI    Safety Planning and Suicide Prevention discussed:  Yes,  discussed with pt.  Pt refused consent to contact family/friend.  See suicide prevention education note.   Carmina MillerHorton, Mina Babula Nicole 01/02/2014, 10:46 AM

## 2014-01-02 NOTE — Progress Notes (Signed)
Discharge Note:  Patient discharged, had money for bus ticket home.  Planned to be discharged with male patient who was discharged with her boyfriend.  Denied SI and HI.  Denied A/V hallucinations.  Denied pain.  Stated he received all his belongings, shoe laces, clothing, toiletries, miscellaneous items, prescriptions, medications, etc.  Suicide prevention information given and discussed with patient who stated he understood and had no questions.  Patient stated he appreciated all assistance [facility].  Patient tried to call male patient from phone in building but could not reach her.   SW informed and this information has been documented per SW requested.

## 2014-01-03 NOTE — Progress Notes (Signed)
Patient mother called furious that her son was discharged without anyone knowing of his release, being mother and father. Mother reports patient was found on her steps without her knowledge asking to stay with family.  Mother reports she left many voices messages for Horace PorteousQuylle the social worker in which [place] off and [place] was covering case.  This Clinical research associatewriter informed mother of patient refusing to let anyone speak with family. SEE BELOW:  CSW retrieved a voicemail message off of coworker's voicemail box, Horace PorteousQuylle, as she was off today. CSW received a voicemail from pt's mother, asking to talk to someone about concerns. Pt refused consent for CSW to talk to anyone during this hospitalization. CSW spoke with pt at this time to ask again and pt continued to refuse. Pt states that mother's only concerns would be for a Child psychotherapistsocial worker to help him get a job.  Reyes IvanChelsea Horton, LCSW  01/02/2014  1:29 PM      Mother feels patient is unsafe, angry they were not contacted and unable to be part of treatment.  LCSW explained again that patient is an adult and legally we cannot involve the family unless patient consents. Mother reports this is unjust and she does not understand how a doctor could just discharge the patient.  Mother was given attendings name and directors phone numbers regarding case if she wanted to discuss with someone else.  Mother reports she wants her son to be readmitted and safe, however patient has been discharged, was not active in programming and DC on 4/6.  Mother began asking for blood tests, drug tests, and labs on patient and LCSW denied giving all information due to HIPPA and no consent from the patient.  LCSW explained if patient wants to be seen and is in danger he welcome to come back for an assessment.  Mother refused.  LCSW explained with mother that she would not argue about the situation and if they had an issue, patient must consent and also be part of conversation with regards to  treatment.  Mother hung up phone reporting she is not [facility].  Ashley JacobsHannah Nail, MSW, LCSW Clinical Lead [phone]

## 2014-01-06 NOTE — Progress Notes (Signed)
Patient Discharge Instructions:  After Visit Summary (AVS):   Faxed to:  01/06/14 Discharge Summary Note:   Faxed to:  01/06/14 Psychiatric Admission Assessment Note:   Faxed to:  01/06/14 Suicide Risk Assessment - Discharge Assessment:   Faxed to:  01/06/14 Faxed/Sent to the Next Level Care provider:  01/06/14 Faxed to [facility] @ [phone]  Jerelene ReddenSheena E [place], 01/06/2014, 3:32 PM

## 2014-03-06 ENCOUNTER — Ambulatory Visit: Payer: Self-pay | Admitting: Internal Medicine

## 2015-07-06 ENCOUNTER — Encounter: Payer: Self-pay | Admitting: Internal Medicine

## 2015-07-06 ENCOUNTER — Ambulatory Visit (INDEPENDENT_AMBULATORY_CARE_PROVIDER_SITE_OTHER): Payer: Managed Care, Other (non HMO) | Admitting: Internal Medicine

## 2015-07-06 ENCOUNTER — Ambulatory Visit (HOSPITAL_COMMUNITY)
Admission: RE | Admit: 2015-07-06 | Discharge: 2015-07-06 | Disposition: A | Discharge status: Home / Self Care | Payer: Managed Care, Other (non HMO) | Source: Ambulatory Visit | Attending: Internal Medicine | Admitting: Internal Medicine

## 2015-07-06 VITALS — BP 125/82 | HR 69 | Temp 98.4°F | Wt 176.0 lb

## 2015-07-06 DIAGNOSIS — R030 Elevated blood-pressure reading, without diagnosis of hypertension: Secondary | ICD-10-CM | POA: Diagnosis not present

## 2015-07-06 DIAGNOSIS — R0789 Other chest pain: Secondary | ICD-10-CM | POA: Diagnosis not present

## 2015-07-06 DIAGNOSIS — IMO0001 Reserved for inherently not codable concepts without codable children: Secondary | ICD-10-CM

## 2015-07-06 MED ORDER — NAPROXEN 500 MG PO TABS: 500.0000 mg | ORAL_TABLET | Freq: Two times a day (BID) | ORAL | Status: DC

## 2015-07-06 NOTE — Patient Instructions (Signed)

## 2015-07-06 NOTE — Progress Notes (Signed)
Subjective:  Patient ID: Jay Stevenson, male    DOB: Sep 28, 1991  Age: 24 y.o. MRN: [id number]  CC: No chief complaint on file.   HPI Jay Stevenson presents for R CP x 2 wks - worse w/cough, deep breathing. He is on Accutane  Outpatient Prescriptions Prior to Visit  Medication Sig Dispense Refill  . busPIRone (BUSPAR) 7.5 MG tablet Take 1 tablet (7.5 mg total) by mouth 2 (two) times daily. For anxiety symptoms. (Patient not taking: Reported on 07/06/2015) 60 tablet 0  . FLUoxetine (PROZAC) 10 MG capsule Take 1 capsule (10 mg total) by mouth daily. For depression. (Patient not taking: Reported on 07/06/2015) 30 capsule 0  . traZODone (DESYREL) 50 MG tablet Take one tablet at bedtime for insomnia. (Patient not taking: Reported on 07/06/2015) 30 tablet 0   No facility-administered medications prior to visit.    ROS Review of Systems  Constitutional: Negative for appetite change, fatigue and unexpected weight change.  HENT: Negative for congestion, nosebleeds, sneezing, sore throat and trouble swallowing.   Eyes: Negative for itching and visual disturbance.  Respiratory: Negative for cough, shortness of breath and wheezing.   Cardiovascular: Positive for chest pain. Negative for palpitations and leg swelling.  Gastrointestinal: Negative for nausea, abdominal pain, diarrhea, blood in stool and abdominal distention.  Genitourinary: Negative for frequency and hematuria.  Musculoskeletal: Negative for back pain, joint swelling, gait problem and neck pain.  Skin: Negative for rash.  Neurological: Negative for dizziness, tremors, speech difficulty and weakness.  Psychiatric/Behavioral: Negative for sleep disturbance, dysphoric mood and agitation. The patient is not nervous/anxious.     Objective:  BP 142/86 mmHg  Pulse 69  Temp(Src) 98.4 F (36.9 C) (Oral)  Wt 176 lb (79.833 kg)  SpO2 97%  BP Readings from Last 3 Encounters:  07/06/15 142/86  01/02/14 129/77  12/28/13 147/68     Wt Readings from Last 3 Encounters:  07/06/15 176 lb (79.833 kg)  12/28/13 177 lb (80.287 kg)  12/28/13 190 lb (86.183 kg)    Physical Exam  Constitutional: He is oriented to person, place, and time. He appears well-developed. No distress.  NAD  HENT:  Mouth/Throat: Oropharynx is clear and moist.  Eyes: Conjunctivae are normal. Pupils are equal, round, and reactive to light.  Neck: Normal range of motion. No JVD present. No thyromegaly present.  Cardiovascular: Normal rate, regular rhythm, normal heart sounds and intact distal pulses.  Exam reveals no gallop and no friction rub.   No murmur heard. Pulmonary/Chest: Effort normal and breath sounds normal. No respiratory distress. He has no wheezes. He has no rales. He exhibits no tenderness.  Abdominal: Soft. Bowel sounds are normal. He exhibits no distension and no mass. There is no tenderness. There is no rebound and no guarding.  Musculoskeletal: Normal range of motion. He exhibits tenderness. He exhibits no edema.  Lymphadenopathy:    He has no cervical adenopathy.  Neurological: He is alert and oriented to person, place, and time. He has normal reflexes. No cranial nerve deficit. He exhibits normal muscle tone. He displays a negative Romberg sign. Coordination and gait normal.  Skin: Skin is warm and dry. No rash noted.  Psychiatric: He has a normal mood and affect. His behavior is normal. Judgment and thought content normal.  chest wall was sensitive over anterior R  Lab Results  Component Value Date   WBC 4.1 12/28/2013   HGB 14.9 12/28/2013   HCT 41.7 12/28/2013   PLT 317 12/28/2013   GLUCOSE 99  12/28/2013   CHOL 161 06/29/2013   TRIG 117 06/29/2013   HDL 45 06/29/2013   LDLCALC 93 06/29/2013   ALT 17 12/28/2013   AST 25 12/28/2013   NA 139 12/28/2013   K 3.8 12/28/2013   CL 103 12/28/2013   CREATININE 0.92 12/28/2013   BUN 15 12/28/2013   CO2 24 12/28/2013   TSH 4.547* 06/29/2013   HGBA1C  04/05/2010     5.0 (NOTE)                                                                       According to the ADA Clinical Practice Recommendations for 2011, when HbA1c is used as a screening test:   >=6.5%   Diagnostic of Diabetes Mellitus           (if abnormal result  is confirmed)  5.7-6.4%   Increased risk of developing Diabetes Mellitus  References:Diagnosis and Classification of Diabetes Mellitus,Diabetes Care,2011,34(Suppl 1):S62-S69 and Standards of Medical Care in         Diabetes - 2011,Diabetes Care,2011,34  (Suppl 1):S11-S61.    No results found.  Assessment & Plan:   Diagnoses and all orders for this visit:  Chest wall pain -     DG Chest 2 View  Elevated BP  Other orders -     naproxen (NAPROSYN) 500 MG tablet; Take 1 tablet (500 mg total) by mouth 2 (two) times daily with a meal.  I am having Jay Stevenson start on naproxen. I am also having him maintain his busPIRone, FLUoxetine, traZODone, MYORISAN, and zolpidem.  Meds ordered this encounter  Medications  . MYORISAN 40 MG capsule    Sig: Take 2 capsules by mouth daily.    Refill:  0  . zolpidem (AMBIEN) 10 MG tablet    Sig: Take 1 tablet by mouth at bedtime as needed.    Refill:  0  . naproxen (NAPROSYN) 500 MG tablet    Sig: Take 1 tablet (500 mg total) by mouth 2 (two) times daily with a meal.    Dispense:  60 tablet    Refill:  1     Follow-up: Return for a follow-up visit.  Sonda Primes, MD

## 2015-07-06 NOTE — Assessment & Plan Note (Signed)
10/16 R ?costochondritis Naproxen bid CXR

## 2015-07-06 NOTE — Assessment & Plan Note (Signed)
Check at home

## 2015-07-06 NOTE — Progress Notes (Signed)
Pre visit review using our clinic review tool, if applicable. No additional management support is needed unless otherwise documented below in the visit note. 

## 2015-07-17 ENCOUNTER — Encounter: Payer: Self-pay | Admitting: Internal Medicine

## 2015-08-14 ENCOUNTER — Emergency Department (HOSPITAL_COMMUNITY)
Admission: EM | Admit: 2015-08-14 | Discharge: 2015-08-14 | Disposition: A | Discharge status: Home / Self Care | Payer: Managed Care, Other (non HMO) | Attending: Emergency Medicine | Admitting: Emergency Medicine

## 2015-08-14 ENCOUNTER — Encounter (HOSPITAL_COMMUNITY): Payer: Self-pay | Admitting: Emergency Medicine

## 2015-08-14 DIAGNOSIS — Z87891 Personal history of nicotine dependence: Secondary | ICD-10-CM | POA: Insufficient documentation

## 2015-08-14 DIAGNOSIS — Z8719 Personal history of other diseases of the digestive system: Secondary | ICD-10-CM | POA: Insufficient documentation

## 2015-08-14 DIAGNOSIS — Z791 Long term (current) use of non-steroidal anti-inflammatories (NSAID): Secondary | ICD-10-CM | POA: Diagnosis not present

## 2015-08-14 DIAGNOSIS — F410 Panic disorder [episodic paroxysmal anxiety] without agoraphobia: Secondary | ICD-10-CM | POA: Diagnosis not present

## 2015-08-14 DIAGNOSIS — Y9389 Activity, other specified: Secondary | ICD-10-CM | POA: Insufficient documentation

## 2015-08-14 DIAGNOSIS — Y9289 Other specified places as the place of occurrence of the external cause: Secondary | ICD-10-CM | POA: Insufficient documentation

## 2015-08-14 DIAGNOSIS — Z79899 Other long term (current) drug therapy: Secondary | ICD-10-CM | POA: Diagnosis not present

## 2015-08-14 DIAGNOSIS — S60460A Insect bite (nonvenomous) of right index finger, initial encounter: Secondary | ICD-10-CM | POA: Insufficient documentation

## 2015-08-14 DIAGNOSIS — W57XXXA Bitten or stung by nonvenomous insect and other nonvenomous arthropods, initial encounter: Secondary | ICD-10-CM | POA: Insufficient documentation

## 2015-08-14 DIAGNOSIS — Y998 Other external cause status: Secondary | ICD-10-CM | POA: Insufficient documentation

## 2015-08-14 MED ORDER — CEPHALEXIN 500 MG PO CAPS: 500.0000 mg | ORAL_CAPSULE | Freq: Four times a day (QID) | ORAL | Status: DC

## 2015-08-14 NOTE — Discharge Instructions (Signed)
Take the prescribed medication as directed. Recommend warm compresses/soaks to affected area. Follow-up with your primary care physician. Return to the ED for new or worsening symptoms.

## 2015-08-14 NOTE — ED Notes (Signed)
Pt states something bit him on the right hand  Pt has a blister to his knuckle with redness and slight swelling noted

## 2015-08-14 NOTE — ED Provider Notes (Signed)
CSN: [id number]     Arrival date & time 08/14/15  0425 History   First MD Initiated Contact with Patient 08/14/15 [phone]     Chief Complaint  Patient presents with  . Insect Bite     (Consider location/radiation/quality/duration/timing/severity/associated sxs/prior Treatment) The history is provided by the patient and medical records.    24 year old male with history of mood disorder, polysubstance abuse, alcohol abuse, seasonal allergies, IBS, presenting to the ED for an insect bite. Patient reports he was bit by some type of insect last night on his right index finger at MCP joint.  He states since last night he has had increased swelling/erythema to area.  He states he only has pain when pressure applied to bite.  Denies drainage.  No fever/chills.  No intervention tried PTA.  VSS.  Past Medical History  Diagnosis Date  . Mood disorder (HCC)     anger management  . Polysubstance abuse   . Alcohol abuse   . H/O: substance abuse   . Panic attack   . Seasonal allergies   . Irritable bowel syndrome 06/27/2013  . IBS (irritable bowel syndrome)    Past Surgical History  Procedure Laterality Date  . Wisdom tooth extraction     Family History  Problem Relation Age of Onset  . Depression Mother   . Colon cancer Paternal Grandfather   . Hypertension Other   . Stroke Other   . Allergies Father   . Allergies Mother   . Asthma Father   . Other Paternal Grandfather     brain tumor   Social History  Substance Use Topics  . Smoking status: Former Smoker -- 1 years    Types: Cigarettes    Quit date: 01/13/2012  . Smokeless tobacco: Never Used     Comment: 2-3 ciggs a week  . Alcohol Use: Yes     Comment: minor    Review of Systems  Skin:       Insect bite  All other systems reviewed and are negative.     Allergies  Review of patient's allergies indicates no known allergies.  Home Medications   Prior to Admission medications   Medication Sig Start Date End Date  Taking? Authorizing Provider  b complex vitamins tablet Take 1 tablet by mouth daily.   Yes Historical Provider, MD  cholecalciferol (VITAMIN D) 1000 UNITS tablet Take 8,000 Units by mouth daily.   Yes Historical Provider, MD  MYORISAN 40 MG capsule Take 2 capsules by mouth daily. 06/29/15  Yes Historical Provider, MD  naproxen (NAPROSYN) 500 MG tablet Take 1 tablet (500 mg total) by mouth 2 (two) times daily with a meal. 07/06/15  Yes Aleksei Plotnikov V, MD  Omega-3 Fatty Acids (FISH OIL PO) Take 1 capsule by mouth daily.   Yes Historical Provider, MD  Zinc Sulfate (ZINC 15 PO) Take 1 tablet by mouth daily.   Yes Historical Provider, MD  busPIRone (BUSPAR) 7.5 MG tablet Take 1 tablet (7.5 mg total) by mouth 2 (two) times daily. For anxiety symptoms. Patient not taking: Reported on 07/06/2015 01/02/14   Tamala Julian, PA-C  FLUoxetine (PROZAC) 10 MG capsule Take 1 capsule (10 mg total) by mouth daily. For depression. Patient not taking: Reported on 07/06/2015 01/02/14   Tamala Julian, PA-C  traZODone (DESYREL) 50 MG tablet Take one tablet at bedtime for insomnia. Patient not taking: Reported on 07/06/2015 01/02/14   Rona Ravens Mashburn, PA-C   BP 147/73 mmHg  Pulse 84  Temp(Src) 98.1 F (36.7 C) (Oral)  Resp 16  SpO2 99%   Physical Exam  Constitutional: He is oriented to person, place, and time. He appears well-developed and well-nourished. No distress.  HENT:  Head: Normocephalic and atraumatic.  Mouth/Throat: Oropharynx is clear and moist.  Eyes: Conjunctivae and EOM are normal. Pupils are equal, round, and reactive to light.  Neck: Normal range of motion. Neck supple.  Cardiovascular: Normal rate, regular rhythm and normal heart sounds.   Pulmonary/Chest: Effort normal and breath sounds normal. No respiratory distress. He has no wheezes.  Musculoskeletal: Normal range of motion.       Hands: Multiple scratches/abrasions noted to right hand which are old; no signs of infection  Neurological:  He is alert and oriented to person, place, and time.  Skin: Skin is warm and dry. He is not diaphoretic.  Psychiatric: He has a normal mood and affect.  Nursing note and vitals reviewed.   ED Course  Procedures (including critical care time) Labs Review Labs Reviewed - No data to display  Imaging Review No results found. I have personally reviewed and evaluated these images and lab results as part of my medical decision-making.   EKG Interpretation None      MDM   Final diagnoses:  Insect bite   24 y.o. M here with insect bite to right index finger MCP with small pustule noted.  Mild localized erythema without cellulitis or lymphangitis.  Encouraged warm compresses, benadryl.  Rx keflex for any signs of infection-- increased redness, swelling, drainage, fever, etc.  Patient will follow-up with PCP.  Discussed plan with patient, he/she acknowledged understanding and agreed with plan of care.  Return precautions given for new or worsening symptoms.  Garlon HatchetLisa M Rayley Gao, PA-C 08/14/15 [phone]  Devoria AlbeIva Knapp, MD 08/14/15 [phone]

## 2015-10-02 ENCOUNTER — Telehealth: Payer: Self-pay | Admitting: Internal Medicine

## 2015-10-02 NOTE — Telephone Encounter (Signed)
Pt called in and needs   Lower back injury and neck

## 2015-10-06 ENCOUNTER — Encounter (HOSPITAL_COMMUNITY): Payer: Self-pay | Admitting: *Deleted

## 2015-10-06 ENCOUNTER — Emergency Department (HOSPITAL_COMMUNITY)
Admission: EM | Admit: 2015-10-06 | Discharge: 2015-10-06 | Disposition: A | Discharge status: Home / Self Care | Payer: Managed Care, Other (non HMO) | Attending: Emergency Medicine | Admitting: Emergency Medicine

## 2015-10-06 DIAGNOSIS — Z87891 Personal history of nicotine dependence: Secondary | ICD-10-CM | POA: Diagnosis not present

## 2015-10-06 DIAGNOSIS — Z792 Long term (current) use of antibiotics: Secondary | ICD-10-CM | POA: Insufficient documentation

## 2015-10-06 DIAGNOSIS — K645 Perianal venous thrombosis: Secondary | ICD-10-CM

## 2015-10-06 DIAGNOSIS — Z791 Long term (current) use of non-steroidal anti-inflammatories (NSAID): Secondary | ICD-10-CM | POA: Insufficient documentation

## 2015-10-06 DIAGNOSIS — K644 Residual hemorrhoidal skin tags: Secondary | ICD-10-CM | POA: Insufficient documentation

## 2015-10-06 DIAGNOSIS — F410 Panic disorder [episodic paroxysmal anxiety] without agoraphobia: Secondary | ICD-10-CM | POA: Diagnosis not present

## 2015-10-06 DIAGNOSIS — K649 Unspecified hemorrhoids: Secondary | ICD-10-CM | POA: Diagnosis present

## 2015-10-06 DIAGNOSIS — Z79899 Other long term (current) drug therapy: Secondary | ICD-10-CM | POA: Diagnosis not present

## 2015-10-06 MED ORDER — LIDOCAINE-EPINEPHRINE 2 %-1:100000 IJ SOLN
INTRAMUSCULAR | Status: AC
  Administered 2015-10-06: 1 mL
  Filled 2015-10-06: qty 1

## 2015-10-06 NOTE — Discharge Instructions (Signed)
Disposable Sitz Bath A disposable sitz bath is a plastic basin that fits over the toilet. A bag is hung above the toilet and is connected to a tube that opens into the disposable sitz bath. The bag is filled with warm water that can flow into the basin through the tube.  HOW TO USE A DISPOSABLE SITZ BATH 1. Close the clamp on the tubing before filling the bag with water. This is to prevent leakage. 2. Fill the sitz bath basin and the plastic bag with warm water. 3. Place the filled basin on the toilet with the seat raised. Make sure the overflow opening is facing toward the back of the toilet. 4. Hang the filled plastic bag overhead on a hook or towel rack close to the toilet. When the bag is unclamped, a steady stream of water will flow from the bag, through the tubing, and into the basin. 5. Attach the tubing to the opening on the basin. 6. Sit on the basin positioned on the toilet seat and release the clamp. This will allow warm water to flush the area around your genitals and anus (perineum). 7. Remain sitting on the basin for approximately 15 to 20 minutes. 8. Stand up and pat the perineum area dry. If needed, apply clean bandages (dressings) to the affected area. 9. Tip the basin into the toilet to remove any remaining water and flush the toilet. 10. Wash the basin with warm water and soap. Let it dry in the sink. 11. Store the basin and tubing in a clean, dry area. 12. Wash your hands with soap and water. SEEK MEDICAL CARE IF: You get worse instead of better. Stop the sitz baths if you get worse. MAKE SURE YOU:  Understand these instructions.  Will watch your condition.  Will get help right away if you are not doing well or get worse.   This information is not intended to replace advice given to you by your health care provider. Make sure you discuss any questions you have with your health care provider.   Document Released: 03/16/2012 Document Revised: 06/09/2012 Document Reviewed:  03/16/2012 Elsevier Interactive Patient Education 2016 [employer] Use the [employer] GroupSitz bath 2-3 times a day for the next 2 days or after BM

## 2015-10-06 NOTE — ED Provider Notes (Signed)
CSN: [id number]     Arrival date & time 10/06/15  0135 History   First MD Initiated Contact with Patient 10/06/15 0224     Chief Complaint  Patient presents with  . Hemorrhoids     (Consider location/radiation/quality/duration/timing/severity/associated sxs/prior Treatment) HPI Comments: Hx of hemorrhoids now with increased pain and swelling despite use of topical creams, sitz baths  Previous Hx hemorrhoid  surgery.  The history is provided by the patient.    Past Medical History  Diagnosis Date  . Mood disorder (HCC)     anger management  . Polysubstance abuse   . Alcohol abuse   . H/O: substance abuse   . Panic attack   . Seasonal allergies   . Irritable bowel syndrome 06/27/2013  . IBS (irritable bowel syndrome)    Past Surgical History  Procedure Laterality Date  . Wisdom tooth extraction     Family History  Problem Relation Age of Onset  . Depression Mother   . Colon cancer Paternal Grandfather   . Hypertension Other   . Stroke Other   . Allergies Father   . Allergies Mother   . Asthma Father   . Other Paternal Grandfather     brain tumor   Social History  Substance Use Topics  . Smoking status: Former Smoker -- 1 years    Types: Cigarettes    Quit date: 01/13/2012  . Smokeless tobacco: Never Used     Comment: 2-3 ciggs a week  . Alcohol Use: Yes     Comment: minor    Review of Systems  Gastrointestinal: Negative for diarrhea, constipation and anal bleeding.       Large hemorrhoid  All other systems reviewed and are negative.     Allergies  Review of patient's allergies indicates no known allergies.  Home Medications   Prior to Admission medications   Medication Sig Start Date End Date Taking? Authorizing Provider  b complex vitamins tablet Take 1 tablet by mouth daily.    Historical Provider, MD  busPIRone (BUSPAR) 7.5 MG tablet Take 1 tablet (7.5 mg total) by mouth 2 (two) times daily. For anxiety symptoms. Patient not taking: Reported on  07/06/2015 01/02/14   Rona Ravens Mashburn, PA-C  cephALEXin (KEFLEX) 500 MG capsule Take 1 capsule (500 mg total) by mouth 4 (four) times daily. 08/14/15   Garlon Hatchet, PA-C  cholecalciferol (VITAMIN D) 1000 UNITS tablet Take 8,000 Units by mouth daily.    Historical Provider, MD  FLUoxetine (PROZAC) 10 MG capsule Take 1 capsule (10 mg total) by mouth daily. For depression. Patient not taking: Reported on 07/06/2015 01/02/14   Rona Ravens Mashburn, PA-C  MYORISAN 40 MG capsule Take 2 capsules by mouth daily. 06/29/15   Historical Provider, MD  naproxen (NAPROSYN) 500 MG tablet Take 1 tablet (500 mg total) by mouth 2 (two) times daily with a meal. 07/06/15   Aleksei Plotnikov V, MD  Omega-3 Fatty Acids (FISH OIL PO) Take 1 capsule by mouth daily.    Historical Provider, MD  traZODone (DESYREL) 50 MG tablet Take one tablet at bedtime for insomnia. Patient not taking: Reported on 07/06/2015 01/02/14   Tamala Julian, PA-C  Zinc Sulfate (ZINC 15 PO) Take 1 tablet by mouth daily.    Historical Provider, MD   BP 144/73 mmHg  Pulse 72  Temp(Src) 97.9 F (36.6 C) (Oral)  Resp 18  SpO2 99% Physical Exam  Constitutional: He appears well-developed and well-nourished.  HENT:  Head: Normocephalic.  Eyes:  Pupils are equal, round, and reactive to light.  Neck: Normal range of motion.  Cardiovascular: Normal rate.   Pulmonary/Chest: Effort normal.  Abdominal: Soft.  Genitourinary: Rectal exam shows external hemorrhoid.    ED Course  Thrombectomy Date/Time: 10/06/2015 2:49 AM Performed by: Earley FavorSCHULZ, Danyiel Crespin Authorized by: Earley FavorSCHULZ, Livia Tarr Consent: Verbal consent obtained. Written consent not obtained. Risks and benefits: risks, benefits and alternatives were discussed Consent given by: patient Patient understanding: patient states understanding of the procedure being performed Patient identity confirmed: verbally with patient Time out: Immediately prior to procedure a "time out" was called to verify the correct patient,  procedure, equipment, support staff and site/side marked as required. Local anesthesia used: yes Local anesthetic: lidocaine 2% with epinephrine Anesthetic total: 1 ml Patient sedated: no Patient tolerance: Patient tolerated the procedure well with no immediate complications Comments: Large clot removed from hemorrhoid   (including critical care time) Labs Review Labs Reviewed - No data to display  Imaging Review No results found. I have personally reviewed and evaluated these images and lab results as part of my medical decision-making.   EKG Interpretation None      MDM   Final diagnoses:  Hemorrhoid thrombosis         Earley FavorGail Zema Lizardo, NP 10/06/15 [id number]  Gilda Creasehristopher J Pollina, MD 10/06/15 [phone]

## 2015-10-06 NOTE — ED Notes (Signed)
PT states that he has a thrombosed hemorrhoid that has been increasing in pain since yesterday; pt states that it is painful to walk and sit

## 2015-10-19 ENCOUNTER — Ambulatory Visit (INDEPENDENT_AMBULATORY_CARE_PROVIDER_SITE_OTHER): Payer: Managed Care, Other (non HMO) | Admitting: Nurse Practitioner

## 2015-10-19 ENCOUNTER — Encounter: Payer: Self-pay | Admitting: Nurse Practitioner

## 2015-10-19 VITALS — BP 160/88 | HR 84 | Temp 97.6°F | Resp 18 | Ht 72.5 in | Wt 174.0 lb

## 2015-10-19 DIAGNOSIS — S46819A Strain of other muscles, fascia and tendons at shoulder and upper arm level, unspecified arm, initial encounter: Secondary | ICD-10-CM | POA: Insufficient documentation

## 2015-10-19 MED ORDER — PREDNISONE 10 MG PO TABS: ORAL_TABLET | ORAL | Status: DC

## 2015-10-19 NOTE — Progress Notes (Signed)
Pre visit review using our clinic review tool, if applicable. No additional management support is needed unless otherwise documented below in the visit note. 

## 2015-10-19 NOTE — Patient Instructions (Addendum)
Prednisone with breakfast or lunch at the latest.  6 tablets on day 1, 5 tablets on day 2, 4 tablets on day 3, 3 tablets on day 4, 2 tablets day 5, 1 tablet on day 6...done! Take tablets all together not spaced out Don't take with NSAIDs (Ibuprofen, Aleve, Naproxen, Meloxicam ect...)   Muscle Strain A muscle strain is an injury that occurs when a muscle is stretched beyond its normal length. Usually a small number of muscle fibers are torn when this happens. Muscle strain is rated in degrees. First-degree strains have the least amount of muscle fiber tearing and pain. Second-degree and third-degree strains have increasingly more tearing and pain.  Usually, recovery from muscle strain takes 1-2 weeks. Complete healing takes 5-6 weeks.  CAUSES  Muscle strain happens when a sudden, violent force placed on a muscle stretches it too far. This may occur with lifting, sports, or a fall.  RISK FACTORS Muscle strain is especially common in athletes.  SIGNS AND SYMPTOMS At the site of the muscle strain, there may be:  Pain.  Bruising.  Swelling.  Difficulty using the muscle due to pain or lack of normal function. DIAGNOSIS  Your health care provider will perform a physical exam and ask about your medical history. TREATMENT  Often, the best treatment for a muscle strain is resting, icing, and applying cold compresses to the injured area.  HOME CARE INSTRUCTIONS   Use the PRICE method of treatment to promote muscle healing during the first 2-3 days after your injury. The PRICE method involves:  Protecting the muscle from being injured again.  Restricting your activity and resting the injured body part.  Icing your injury. To do this, put ice in a plastic bag. Place a towel between your skin and the bag. Then, apply the ice and leave it on from 15-20 minutes each hour. After the third day, switch to moist heat packs.  Apply compression to the injured area with a splint or elastic bandage. Be  careful not to wrap it too tightly. This may interfere with blood circulation or increase swelling.  Elevate the injured body part above the level of your heart as often as you can.  Only take over-the-counter or prescription medicines for pain, discomfort, or fever as directed by your health care provider.  Warming up prior to exercise helps to prevent future muscle strains. SEEK MEDICAL CARE IF:   You have increasing pain or swelling in the injured area.  You have numbness, tingling, or a significant loss of strength in the injured area. MAKE SURE YOU:   Understand these instructions.  Will watch your condition.  Will get help right away if you are not doing well or get worse.   This information is not intended to replace advice given to you by your health care provider. Make sure you discuss any questions you have with your health care provider.   Document Released: 09/15/2005 Document Revised: 07/06/2013 Document Reviewed: 04/14/2013 Elsevier Interactive Patient Education [employer]

## 2015-10-19 NOTE — Assessment & Plan Note (Signed)
Bilateral trapezius strain.  RICE, heat alternating, professional massage advised  Prednisone given with instructions verbally and on AVS FU prn worsening/failure to improve.

## 2015-10-19 NOTE — Progress Notes (Signed)
Patient ID: Jay Stevenson, male    DOB: 10/10/1990  Age: 25 y.o. MRN: [id number]  CC: Neck Pain   HPI Jay Stevenson presents for CC of myalgias of the last x 3 months.   1) Onset- 3 months ago Location- Right trapezius > Left  Duration - Intermittent  Characteristics- Muscle ache, tightness  Aggravating factors- Exercising and sleeping Relieving factors- Massaging helps for a little while  Severity- 5-6/10 at worst, Best 2/10   Treatment to date:  Naproxen- not helpful   History Jay Stevenson has a past medical history of Mood disorder (HCC); Polysubstance abuse; Alcohol abuse; H/O: substance abuse; Panic attack; Seasonal allergies; Irritable bowel syndrome (06/27/2013); and IBS (irritable bowel syndrome).   Jay Stevenson has past surgical history that includes Wisdom tooth extraction.   His family history includes Allergies in his father and mother; Asthma in his father; Colon cancer in his paternal grandfather; Depression in his mother; Hypertension in his other; Other in his paternal grandfather; Stroke in his other.Jay Stevenson reports that Jay Stevenson quit smoking about 3 years ago. His smoking use included Cigarettes. Jay Stevenson quit after 1 year of use. Jay Stevenson has never used smokeless tobacco. Jay Stevenson reports that Jay Stevenson drinks alcohol. Jay Stevenson reports that Jay Stevenson does not use illicit drugs.  Outpatient Prescriptions Prior to Visit  Medication Sig Dispense Refill  . cholecalciferol (VITAMIN D) 1000 UNITS tablet Take 8,000 Units by mouth daily.    Marland Kitchen MYORISAN 40 MG capsule Take 2 capsules by mouth daily.  0  . Omega-3 Fatty Acids (FISH OIL PO) Take 1 capsule by mouth daily.    . Zinc Sulfate (ZINC 15 PO) Take 1 tablet by mouth daily.    Marland Kitchen b complex vitamins tablet Take 1 tablet by mouth daily.    . busPIRone (BUSPAR) 7.5 MG tablet Take 1 tablet (7.5 mg total) by mouth 2 (two) times daily. For anxiety symptoms. (Patient not taking: Reported on 07/06/2015) 60 tablet 0  . cephALEXin (KEFLEX) 500 MG capsule Take 1 capsule (500 mg total) by mouth 4  (four) times daily. 40 capsule 0  . FLUoxetine (PROZAC) 10 MG capsule Take 1 capsule (10 mg total) by mouth daily. For depression. (Patient not taking: Reported on 07/06/2015) 30 capsule 0  . naproxen (NAPROSYN) 500 MG tablet Take 1 tablet (500 mg total) by mouth 2 (two) times daily with a meal. 60 tablet 1  . traZODone (DESYREL) 50 MG tablet Take one tablet at bedtime for insomnia. (Patient not taking: Reported on 07/06/2015) 30 tablet 0   No facility-administered medications prior to visit.    ROS Review of Systems  Constitutional: Negative for fever, chills, diaphoresis and fatigue.  Musculoskeletal: Positive for myalgias and neck pain. Negative for back pain, joint swelling, arthralgias, gait problem and neck stiffness.    Objective:  BP 160/88 mmHg  Pulse 84  Temp(Src) 97.6 F (36.4 C) (Oral)  Resp 18  Ht 6' 0.5" (1.842 m)  Wt 174 lb (78.926 kg)  BMI 23.26 kg/m2  SpO2 96%   Physical Exam  Constitutional: Jay Stevenson is oriented to person, place, and time. Jay Stevenson appears well-developed and well-nourished. No distress.  Musculoskeletal: Normal range of motion. Jay Stevenson exhibits tenderness. Jay Stevenson exhibits no edema.  Tenderness and tightness of bilateral trapezius, no difference between left and right sides  Neurological: Jay Stevenson is alert and oriented to person, place, and time. No cranial nerve deficit. Jay Stevenson exhibits normal muscle tone. Coordination normal.  Deltoid 5/5 Bilateral, Biceps 5/5 bilateral, Wrist extensors 5/5 bilateral, Triceps 5/5 bilateral, finger flexors and  abductors 5/5 bilateral, grip 5/5 bilateraly heel/toe/sequential walking, sensation intact upper and lower extremities. Straight leg raise negative bilaterally. DTR's upper and lower 2+   Skin: Skin is warm and dry. No rash noted. Jay Stevenson is not diaphoretic.  Psychiatric: Jay Stevenson has a normal mood and affect. His behavior is normal.  Going to a new psychiatrist soon   Assessment & Plan:   Jay Stevenson was seen today for neck pain.  Diagnoses and all  orders for this visit:  Trapezius muscle strain, unspecified laterality, initial encounter  Other orders -     predniSONE (DELTASONE) 10 MG tablet; Take 6 tablets by mouth on day 1 with breakfast then decrease by 1 tablet each day until gone.   Jay Stevenson have discontinued Jay Stevenson's busPIRone, FLUoxetine, traZODone, naproxen, b complex vitamins, and cephALEXin. Jay Stevenson am also having him start on predniSONE. Additionally, Jay Stevenson am having him maintain his MYORISAN, cholecalciferol, Omega-3 Fatty Acids (FISH OIL PO), and Zinc Sulfate (ZINC 15 PO).  Meds ordered this encounter  Medications  . predniSONE (DELTASONE) 10 MG tablet    Sig: Take 6 tablets by mouth on day 1 with breakfast then decrease by 1 tablet each day until gone.    Dispense:  21 tablet    Refill:  0    Order Specific Question:  Supervising Provider    Answer:  Sherlene Shams [[id number]]     Follow-up: Return if symptoms worsen or fail to improve.

## 2016-03-07 ENCOUNTER — Ambulatory Visit (INDEPENDENT_AMBULATORY_CARE_PROVIDER_SITE_OTHER): Payer: Managed Care, Other (non HMO)

## 2016-03-07 ENCOUNTER — Ambulatory Visit (INDEPENDENT_AMBULATORY_CARE_PROVIDER_SITE_OTHER): Payer: Managed Care, Other (non HMO) | Admitting: Physician Assistant

## 2016-03-07 VITALS — BP 124/80 | HR 97 | Temp 97.8°F | Resp 16 | Ht 72.5 in | Wt 174.0 lb

## 2016-03-07 DIAGNOSIS — M79645 Pain in left finger(s): Secondary | ICD-10-CM

## 2016-03-07 MED ORDER — MELOXICAM 15 MG PO TABS: 15.0000 mg | ORAL_TABLET | Freq: Every day | ORAL | Status: DC

## 2016-03-07 NOTE — Patient Instructions (Signed)
     IF you received an x-ray today, you will receive an invoice from [facility]. Please contact [facility] at [phone] with questions or concerns regarding your invoice.   IF you received labwork today, you will receive an invoice from [facility]. Please contact [facility] at [phone] with questions or concerns regarding your invoice.   Our billing staff will not be able to assist you with questions regarding bills from these companies.  You will be contacted with the lab results as soon as they are available. The fastest way to get your results is to activate your My Chart account. Instructions are located on the last page of this paperwork. If you have not heard from us regarding the results in 2 weeks, please contact this office.      

## 2016-03-07 NOTE — Progress Notes (Signed)
Patient ID: Jay Stevenson, male     DOB: 12-27-1990, 25 y.o.    MRN: [id number]  PCP: Sonda PrimesAlex Plotnikov, MD  Chief Complaint  Patient presents with  . Finger Injury    Left 2nd     Subjective:    HPI  Presents for evaluation of LEFT index finger pain. Throwing a football with a friend yesterday. Ball hit the LEFT index finger. Possible hyperextension injury. Pain in the entire finger, worst dorsally, just proximal to the nail. Tried ice and heat and stretching. No meds.  LEFT hand dominant.   Prior to Admission medications   Medication Sig Start Date End Date Taking? Authorizing Provider  busPIRone (BUSPAR) 30 MG tablet Take 15 mg by mouth 2 (two) times daily.   Yes Historical Provider, MD  cholecalciferol (VITAMIN D) 1000 UNITS tablet Take 8,000 Units by mouth daily.   Yes Historical Provider, MD  FLUoxetine (PROZAC) 10 MG capsule Take 30 mg by mouth daily.   Yes Historical Provider, MD  MYORISAN 40 MG capsule Take 3 capsules by mouth daily.  06/29/15  Yes Historical Provider, MD  Omega-3 Fatty Acids (FISH OIL PO) Take 1 capsule by mouth daily.   Yes Historical Provider, MD  Zinc Sulfate (ZINC 15 PO) Take 1 tablet by mouth daily.   Yes Historical Provider, MD     No Known Allergies   Patient Active Problem List   Diagnosis Date Noted  . Trapezius muscle strain 10/19/2015  . Elevated BP 07/06/2015  . MDD (major depressive disorder) (HCC) 12/28/2013  . Anxiety 10/29/2012  . Seasonal and perennial allergic rhinitis 05/22/2012  . Constipation - functional 04/14/2012  . History of substance abuse 06/16/2011  . History of alcohol abuse 06/16/2011  . Mood disorder (HCC) 06/13/2011     Family History  Problem Relation Age of Onset  . Depression Mother   . Colon cancer Paternal Grandfather   . Hypertension Other   . Stroke Other   . Allergies Father   . Allergies Mother   . Asthma Father   . Other Paternal Grandfather     brain tumor     Social History    Social History  . Marital Status: Single    Spouse Name: n/a  . Number of Children: 0  . Years of Education: N/A   Occupational History  . unemployed     previous Conservation officer, naturecashier, floor work   Social History Main Topics  . Smoking status: Former Smoker -- 1 years    Types: Cigarettes    Quit date: 01/13/2012  . Smokeless tobacco: Never Used     Comment: 2-3 ciggs a week  . Alcohol Use: Yes     Comment: minor  . Drug Use: No     Comment: quit in Aug 2012  . Sexual Activity: Yes    Birth Control/ Protection: Condom   Other Topics Concern  . Not on file   Social History Narrative   Student at [employer] working on an associates degree in the arts. Graduated from [employer].   Lives alone.   [place], [place]        Review of Systems       Objective:  Physical Exam  Constitutional: He is oriented to person, place, and time. He appears well-developed and well-nourished. He is active and cooperative. No distress.  BP 124/80 mmHg  Pulse 97  Temp(Src) 97.8 F (36.6 C) (Oral)  Resp 16  Ht 6' 0.5" (1.842 m)  Wt 174 lb (78.926 kg)  BMI 23.26 kg/m2  SpO2 95%   Eyes: Conjunctivae are normal.  Pulmonary/Chest: Effort normal.  Musculoskeletal:       Left hand: He exhibits decreased range of motion, tenderness, bony tenderness and swelling. He exhibits normal capillary refill, no deformity and no laceration. Normal sensation noted. Decreased strength noted. He exhibits finger abduction. He exhibits no wrist extension trouble.  Mild swelling of the distal LEFT index finger. Tenderness of the DIP. No ecchymosis.  Neurological: He is alert and oriented to person, place, and time.  Psychiatric: He has a normal mood and affect. His speech is normal and behavior is normal.         Dg Finger Index Left  03/07/2016  CLINICAL DATA:  Hyperextension injury of the left index finger distal interphalangeal joint. EXAM: LEFT INDEX FINGER 2+V COMPARISON:  None.  FINDINGS: There is no evidence of fracture or dislocation. There is no evidence of arthropathy or other focal bone abnormality. Soft tissues are unremarkable. IMPRESSION: Negative. Electronically Signed   By: Gaylyn Rong M.D.   On: 03/07/2016 17:19        Assessment & Plan:  1. Finger pain, left Radiographs negative for fracture. Sprain most likely. Fold-over finger splint applied for comfort. Meloxicam. If pain persists in 2 weeks, re-evaluate with repeat radiographs.  - DG Finger Index Left; Future - meloxicam (MOBIC) 15 MG tablet; Take 1 tablet (15 mg total) by mouth daily.  Dispense: 30 tablet; Refill: 0   Fernande Bras, PA-C Physician Assistant-Certified [facility]

## 2016-12-02 ENCOUNTER — Ambulatory Visit (INDEPENDENT_AMBULATORY_CARE_PROVIDER_SITE_OTHER): Payer: Managed Care, Other (non HMO) | Admitting: Nurse Practitioner

## 2016-12-02 ENCOUNTER — Encounter: Payer: Self-pay | Admitting: Nurse Practitioner

## 2016-12-02 VITALS — BP 128/80 | HR 70 | Temp 97.5°F | Ht 72.5 in | Wt 171.0 lb

## 2016-12-02 DIAGNOSIS — K644 Residual hemorrhoidal skin tags: Secondary | ICD-10-CM | POA: Diagnosis not present

## 2016-12-02 MED ORDER — HYDROCORTISONE ACETATE 25 MG RE SUPP: 25.0000 mg | Freq: Two times a day (BID) | RECTAL | 0 refills | Status: DC

## 2016-12-02 NOTE — Patient Instructions (Signed)
  Hemorrhoids Hemorrhoids are swollen veins in and around the rectum or anus. Hemorrhoids can cause pain, itching, or bleeding. Most of the time, they do not cause serious problems. They usually get better with diet changes, lifestyle changes, and other home treatments. Follow these instructions at home: Eating and drinking   Eat foods that have fiber, such as whole grains, beans, nuts, fruits, and vegetables. Ask your doctor about taking products that have added fiber (fibersupplements).  Drink enough fluid to keep your pee (urine) clear or pale yellow. For Pain and Swelling   Take a warm-water bath and teaspoon of epsom salt (sitz bath) for 20 minutes to ease pain. Do this 3-4 times a day.  If directed, put ice on the painful area. It may be helpful to use ice between your warm baths.  Put ice in a plastic bag.  Place a towel between your skin and the bag.  Leave the ice on for 20 minutes, 2-3 times a day. General instructions   Take over-the-counter and prescription medicines only as told by your doctor.  Medicated creams and medicines that are inserted into the anus (suppositories) may be used or applied as told.  Exercise often.  Go to the bathroom when you have the urge to poop (to have a bowel movement). Do not wait.  Avoid pushing too hard (straining) when you poop.  Keep the butt area dry and clean. Use wet toilet paper or moist paper towels.  Do not sit on the toilet for a long time. Contact a doctor if:  You have any of these:  Pain and swelling that do not get better with treatment or medicine.  Bleeding that will not stop.  Trouble pooping or you cannot poop.  Pain or swelling outside the area of the hemorrhoids. This information is not intended to replace advice given to you by your health care provider. Make sure you discuss any questions you have with your health care provider. Document Released: 06/24/2008 Document Revised: 02/21/2016 Document Reviewed:  05/30/2015 Elsevier Interactive Patient Education  2017 [employer]

## 2016-12-02 NOTE — Progress Notes (Signed)
Pre visit review using our clinic review tool, if applicable. No additional management support is needed unless otherwise documented below in the visit note. 

## 2016-12-02 NOTE — Progress Notes (Signed)
Subjective:  Patient ID: Jay Stevenson, male    DOB: March 07, 1991  Age: 26 y.o. MRN: [id number]  CC: Follow-up (hemrrhoid painful/need some help)   GI Problem  Primary symptoms do not include fever, weight loss, fatigue, abdominal pain, nausea, vomiting, diarrhea, melena, hematemesis, jaundice, hematochezia, dysuria, myalgias, arthralgias or rash. Primary symptoms comment: rectal pain. The illness began 6 to 7 days ago. The onset was sudden. The problem has not changed since onset. The illness does not include chills, anorexia, dysphagia, odynophagia, bloating, constipation, tenesmus, back pain or itching. Significant associated medical issues include hemorrhoids. Associated medical issues do not include inflammatory bowel disease, GERD, irritable bowel syndrome or diverticulitis.    Outpatient Medications Prior to Visit  Medication Sig Dispense Refill  . busPIRone (BUSPAR) 30 MG tablet Take 15 mg by mouth 2 (two) times daily.    . cholecalciferol (VITAMIN D) 1000 UNITS tablet Take 8,000 Units by mouth daily.    Marland Kitchen. FLUoxetine (PROZAC) 10 MG capsule Take 30 mg by mouth daily.    . Omega-3 Fatty Acids (FISH OIL PO) Take 1 capsule by mouth daily.    . Zinc Sulfate (ZINC 15 PO) Take 1 tablet by mouth daily.    . meloxicam (MOBIC) 15 MG tablet Take 1 tablet (15 mg total) by mouth daily. (Patient not taking: Reported on 12/02/2016) 30 tablet 0  . MYORISAN 40 MG capsule Take 3 capsules by mouth daily.   0   No facility-administered medications prior to visit.     ROS See HPI  Objective:  BP 128/80   Pulse 70   Temp 97.5 F (36.4 C)   Ht 6' 0.5" (1.842 m)   Wt 171 lb (77.6 kg)   SpO2 99%   BMI 22.87 kg/m   BP Readings from Last 3 Encounters:  12/02/16 128/80  03/07/16 124/80  10/19/15 (!) 160/88    Wt Readings from Last 3 Encounters:  12/02/16 171 lb (77.6 kg)  03/07/16 174 lb (78.9 kg)  10/19/15 174 lb (78.9 kg)    Physical Exam  Abdominal: Soft. He exhibits no  distension. There is no tenderness.  Genitourinary: Rectal exam shows external hemorrhoid.  Genitourinary Comments: Non thrombosed external hemorrhoid.  Vitals reviewed.   Lab Results  Component Value Date   WBC 4.1 12/28/2013   HGB 14.9 12/28/2013   HCT 41.7 12/28/2013   PLT 317 12/28/2013   GLUCOSE 99 12/28/2013   CHOL 161 06/29/2013   TRIG 117 06/29/2013   HDL 45 06/29/2013   LDLCALC 93 06/29/2013   ALT 17 12/28/2013   AST 25 12/28/2013   NA 139 12/28/2013   K 3.8 12/28/2013   CL 103 12/28/2013   CREATININE 0.92 12/28/2013   BUN 15 12/28/2013   CO2 24 12/28/2013   TSH 4.547 (H) 06/29/2013   HGBA1C  04/05/2010    5.0 (NOTE)                                                                       According to the ADA Clinical Practice Recommendations for 2011, when HbA1c is used as a screening test:   >=6.5%   Diagnostic of Diabetes Mellitus           (if abnormal  result  is confirmed)  5.7-6.4%   Increased risk of developing Diabetes Mellitus  References:Diagnosis and Classification of Diabetes Mellitus,Diabetes Care,2011,34(Suppl 1):S62-S69 and Standards of Medical Care in         Diabetes - 2011,Diabetes Care,2011,34  (Suppl 1):S11-S61.    No results found.  Assessment & Plan:   Delonte was seen today for follow-up.  Diagnoses and all orders for this visit:  External hemorrhoids without complication -     hydrocortisone (ANUSOL-HC) 25 MG suppository; Place 1 suppository (25 mg total) rectally 2 (two) times daily.   I am having Mr. Neary start on hydrocortisone. I am also having him maintain his MYORISAN, cholecalciferol, Omega-3 Fatty Acids (FISH OIL PO), Zinc Sulfate (ZINC 15 PO), FLUoxetine, busPIRone, meloxicam, and clonazePAM.  Meds ordered this encounter  Medications  . clonazePAM (KLONOPIN) 1 MG tablet    Sig: Take 1 mg by mouth 2 (two) times daily.  . hydrocortisone (ANUSOL-HC) 25 MG suppository    Sig: Place 1 suppository (25 mg total) rectally 2 (two)  times daily.    Dispense:  12 suppository    Refill:  0    Order Specific Question:   Supervising Provider    Answer:   Tresa Garter [[id number]]    Follow-up: Return if symptoms worsen or fail to improve.  Alysia Penna, NP

## 2016-12-19 ENCOUNTER — Encounter: Payer: Self-pay | Admitting: Internal Medicine

## 2018-06-13 ENCOUNTER — Other Ambulatory Visit: Payer: Self-pay

## 2018-06-13 ENCOUNTER — Encounter (HOSPITAL_COMMUNITY): Payer: Self-pay | Admitting: Emergency Medicine

## 2018-06-13 ENCOUNTER — Emergency Department (HOSPITAL_COMMUNITY)
Admission: EM | Admit: 2018-06-13 | Discharge: 2018-06-13 | Disposition: A | Discharge status: Home / Self Care | Payer: 59 | Attending: Emergency Medicine | Admitting: Emergency Medicine

## 2018-06-13 ENCOUNTER — Emergency Department (HOSPITAL_COMMUNITY): Payer: 59

## 2018-06-13 DIAGNOSIS — F1910 Other psychoactive substance abuse, uncomplicated: Secondary | ICD-10-CM | POA: Diagnosis not present

## 2018-06-13 DIAGNOSIS — Z79899 Other long term (current) drug therapy: Secondary | ICD-10-CM | POA: Insufficient documentation

## 2018-06-13 DIAGNOSIS — M25571 Pain in right ankle and joints of right foot: Secondary | ICD-10-CM | POA: Insufficient documentation

## 2018-06-13 DIAGNOSIS — Z87891 Personal history of nicotine dependence: Secondary | ICD-10-CM | POA: Diagnosis not present

## 2018-06-13 DIAGNOSIS — S99911A Unspecified injury of right ankle, initial encounter: Secondary | ICD-10-CM

## 2018-06-13 MED ORDER — IBUPROFEN 200 MG PO TABS: 600.0000 mg | ORAL_TABLET | Freq: Once | ORAL | Status: DC

## 2018-06-13 MED ORDER — NAPROXEN 500 MG PO TABS: 500.0000 mg | ORAL_TABLET | Freq: Once | ORAL | Status: DC

## 2018-06-13 NOTE — Discharge Instructions (Signed)
Please read instructions below. Apply ice to your ankle for 20 minutes at a time. Elevate it when possible. Use the crutches for walking and do no bear weight to allow your ankle to heal. If you continue to have pain after 1 week, schedule an appointment with the orthopedic specialist to follow up on your injury. You can take aleve every 12 hours as needed for pain. Return to the ER for new or concerning symptoms.

## 2018-06-13 NOTE — ED Provider Notes (Signed)
[facility]   CSN: [id number] Arrival date & time: 06/13/18  0603     History   Chief Complaint Chief Complaint  Patient presents with  . Ankle Pain    HPI Jay Stevenson is a 27 y.o. male with past medical history of polysubstance abuse, presenting to the ED with complaint of right ankle pain that began early this morning.  He states he stepped down off a large step onto what appeared to be a tree root and thinks he rolled his ankle.  Pain is located on the lateral aspect of his ankle that is worse with movement and palpation.  No medications tried prior to arrival.  Denies previous injury to this ankle.  Denies fall or hit his head.  Does endorse alcohol use last night.  The history is provided by the patient.    Past Medical History:  Diagnosis Date  . Acute urinary retention 04/14/2012   6/13 due to oxycodone; he had to be cathed   . Alcohol abuse   . Ataxia 06/16/2011   Induced by prior drug use, I guess   . Depression   . H/O: substance abuse   . IBS (irritable bowel syndrome)   . Irritable bowel syndrome 06/27/2013  . Mood disorder (HCC)    anger management  . Panic attack   . Polysubstance abuse (HCC)   . Seasonal allergies   . Thrombosed hemorrhoids 04/14/2012   Summer 2013; seems to be related to constipation; painful - s/p incision     Patient Active Problem List   Diagnosis Date Noted  . Trapezius muscle strain 10/19/2015  . Elevated BP 07/06/2015  . MDD (major depressive disorder) 12/28/2013  . Anxiety 10/29/2012  . Seasonal and perennial allergic rhinitis 05/22/2012  . Constipation - functional 04/14/2012  . History of substance abuse 06/16/2011  . History of alcohol abuse 06/16/2011  . Mood disorder (HCC) 06/13/2011    Past Surgical History:  Procedure Laterality Date  . WISDOM TOOTH EXTRACTION          Home Medications    Prior to Admission medications   Medication Sig Start Date  End Date Taking? Authorizing Provider  busPIRone (BUSPAR) 30 MG tablet Take 15 mg by mouth 2 (two) times daily.    [provider]  cholecalciferol (VITAMIN D) 1000 UNITS tablet Take 8,000 Units by mouth daily.    [provider]  clonazePAM (KLONOPIN) 1 MG tablet Take 1 mg by mouth 2 (two) times daily.    [provider]  FLUoxetine (PROZAC) 10 MG capsule Take 30 mg by mouth daily.    [provider]  hydrocortisone (ANUSOL-HC) 25 MG suppository Place 1 suppository (25 mg total) rectally 2 (two) times daily. 12/02/16   Nche, Bonna Gains, NP  meloxicam (MOBIC) 15 MG tablet Take 1 tablet (15 mg total) by mouth daily. Patient not taking: Reported on 12/02/2016 03/07/16   Porfirio Oar, PA  MYORISAN 40 MG capsule Take 3 capsules by mouth daily.  06/29/15   [provider]  Omega-3 Fatty Acids (FISH OIL PO) Take 1 capsule by mouth daily.    [provider]  Zinc Sulfate (ZINC 15 PO) Take 1 tablet by mouth daily.    [provider]    Family History Family History  Problem Relation Age of Onset  . Depression Mother   . Colon cancer Paternal Grandfather   . Hypertension Other   . Stroke Other   .  Allergies Father   . Allergies Mother   . Asthma Father   . Other Paternal Grandfather        brain tumor    Social History Social History   Tobacco Use  . Smoking status: Former Smoker    Years: 1.00    Types: Cigarettes    Last attempt to quit: 01/13/2012    Years since quitting: 6.4  . Smokeless tobacco: Never Used  . Tobacco comment: 2-3 ciggs a week  Substance Use Topics  . Alcohol use: Yes    Comment: minor  . Drug use: No    Types: Marijuana, Methamphetamines, Oxycodone    Comment: quit in Aug 2012     Allergies   Patient has no known allergies.   Review of Systems Review of Systems  Musculoskeletal: Positive for arthralgias and joint swelling.  Skin: Negative for wound.  Neurological: Negative for syncope and  numbness.     Physical Exam Updated Vital Signs BP 130/87 (BP Location: Left Arm)   Pulse 76   Temp 98.4 F (36.9 C) (Oral)   Resp 19   Ht 6\' 1"  (1.854 m)   Wt 83.9 kg   SpO2 98%   BMI 24.41 kg/m   Physical Exam  Constitutional: He is oriented to person, place, and time. He appears well-developed and well-nourished. No distress.  HENT:  Head: Normocephalic and atraumatic.  Eyes: Conjunctivae are normal.  Cardiovascular: Normal rate and intact distal pulses.  Pulmonary/Chest: Effort normal.  Musculoskeletal:  Right ankle with swelling and tenderness to the lateral aspect.  Pain with passive plantarflexion and inversion of ankle.  Achilles is intact.  No bony tenderness to medial or lateral malleoli.  No ecchymosis or wounds.  Normal sensation.  Intact distal pulses.  Neurological: He is alert and oriented to person, place, and time.  Psychiatric: He has a normal mood and affect. His behavior is normal.  Nursing note and vitals reviewed.    ED Treatments / Results  Labs (all labs ordered are listed, but only abnormal results are displayed) Labs Reviewed - No data to display  EKG None  Radiology Dg Ankle Complete Right  Result Date: 06/13/2018 CLINICAL DATA:  Right ankle pain and swelling after injury. EXAM: RIGHT ANKLE - COMPLETE 3+ VIEW COMPARISON:  None. FINDINGS: There is no evidence of fracture, dislocation, or joint effusion. There is no evidence of arthropathy or other focal bone abnormality. Lateral soft tissue edema. IMPRESSION: Lateral soft tissue edema without fracture or dislocation. Electronically Signed   By: Narda Rutherford M.D.   On: 06/13/2018 06:41    Procedures Procedures (including critical care time)  Medications Ordered in ED Medications  naproxen (NAPROSYN) tablet 500 mg (has no administration in time range)     Initial Impression / Assessment and Plan / ED Course  I have reviewed the triage vital signs and the nursing notes.  Pertinent  labs & imaging results that were available during my care of the patient were reviewed by me and considered in my medical decision making (see chart for details).     Patient with right ankle injury after rolling it prior to arrival.  Suspect sprain versus strain.  X-Ray negative for obvious fracture or dislocation. Pain treated in ED. Pt is alert and oriented.  ASO brace applied, crutches, and pt advised to follow up with orthopedics if symptoms persist. RICE therapy recommended and discussed.  Patient will be dc home & is agreeable with above plan.  Discussed results, findings, treatment  and follow up. Patient advised of return precautions. Patient verbalized understanding and agreed with plan.  Final Clinical Impressions(s) / ED Diagnoses   Final diagnoses:  Right ankle injury, initial encounter    ED Discharge Orders    None       Robinson, [place] N, PA-C 06/13/18 [id number]    Mancel BaleWentz, Elliott, MD 06/14/18 1626

## 2018-06-13 NOTE — ED Triage Notes (Signed)
[facility] pain after stepping down on a step and swelling to right lateral ankle. BP 140/90, Pulse 90, CBG 89. Pt does report to drinking last night. Currently pt is alert and oriented x 4.

## 2019-07-21 IMAGING — CR DG ANKLE COMPLETE 3+V*R*
3 series · 3 of 3 positions shown · non-contrast
Comparison: None.

CLINICAL DATA: Right ankle pain and swelling after injury.

EXAM:
RIGHT ANKLE - COMPLETE 3+ VIEW

[x ankle ap right]
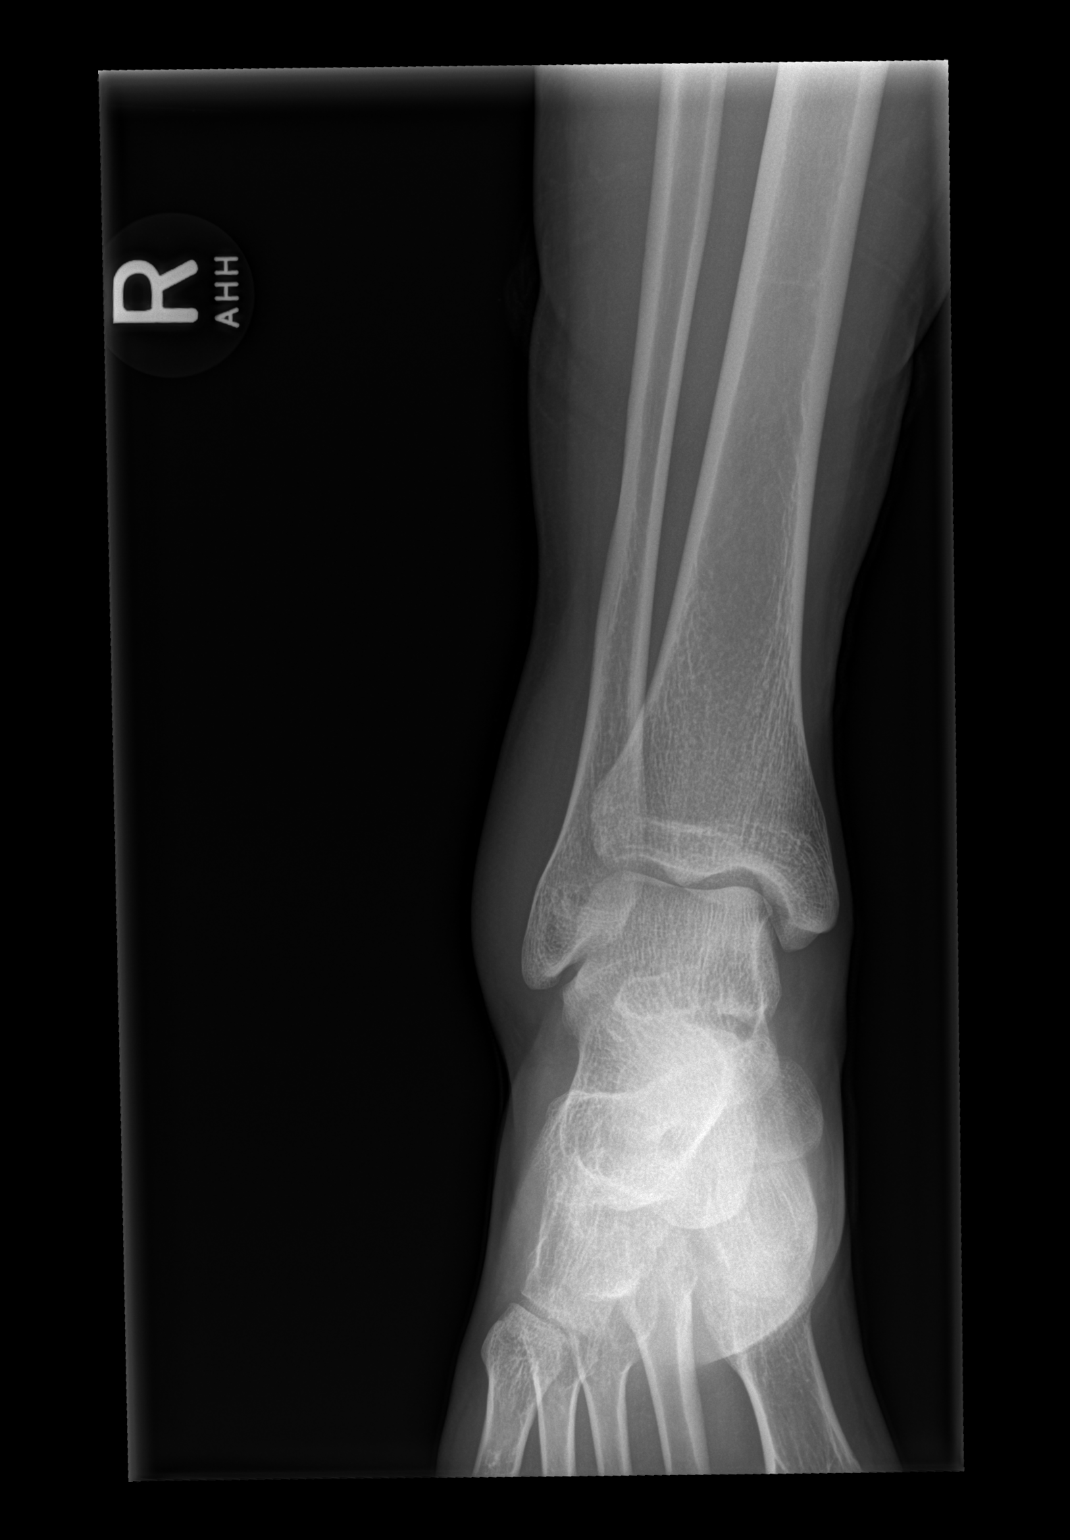

[x ankle obl right]
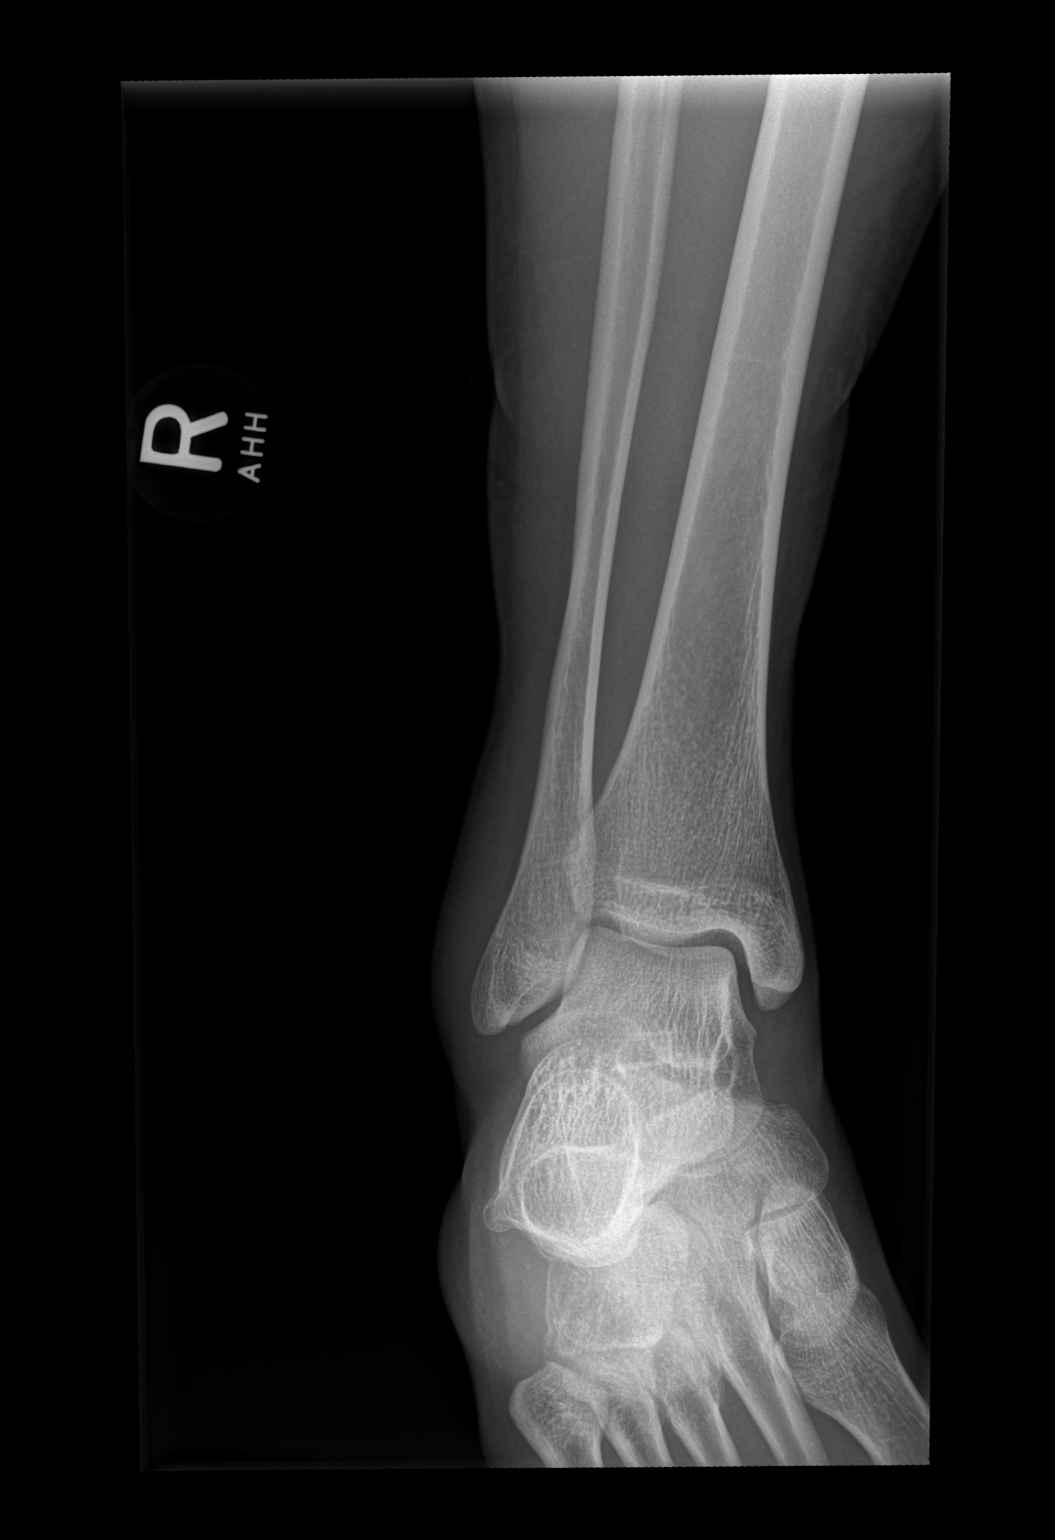

[x ankle lat right]
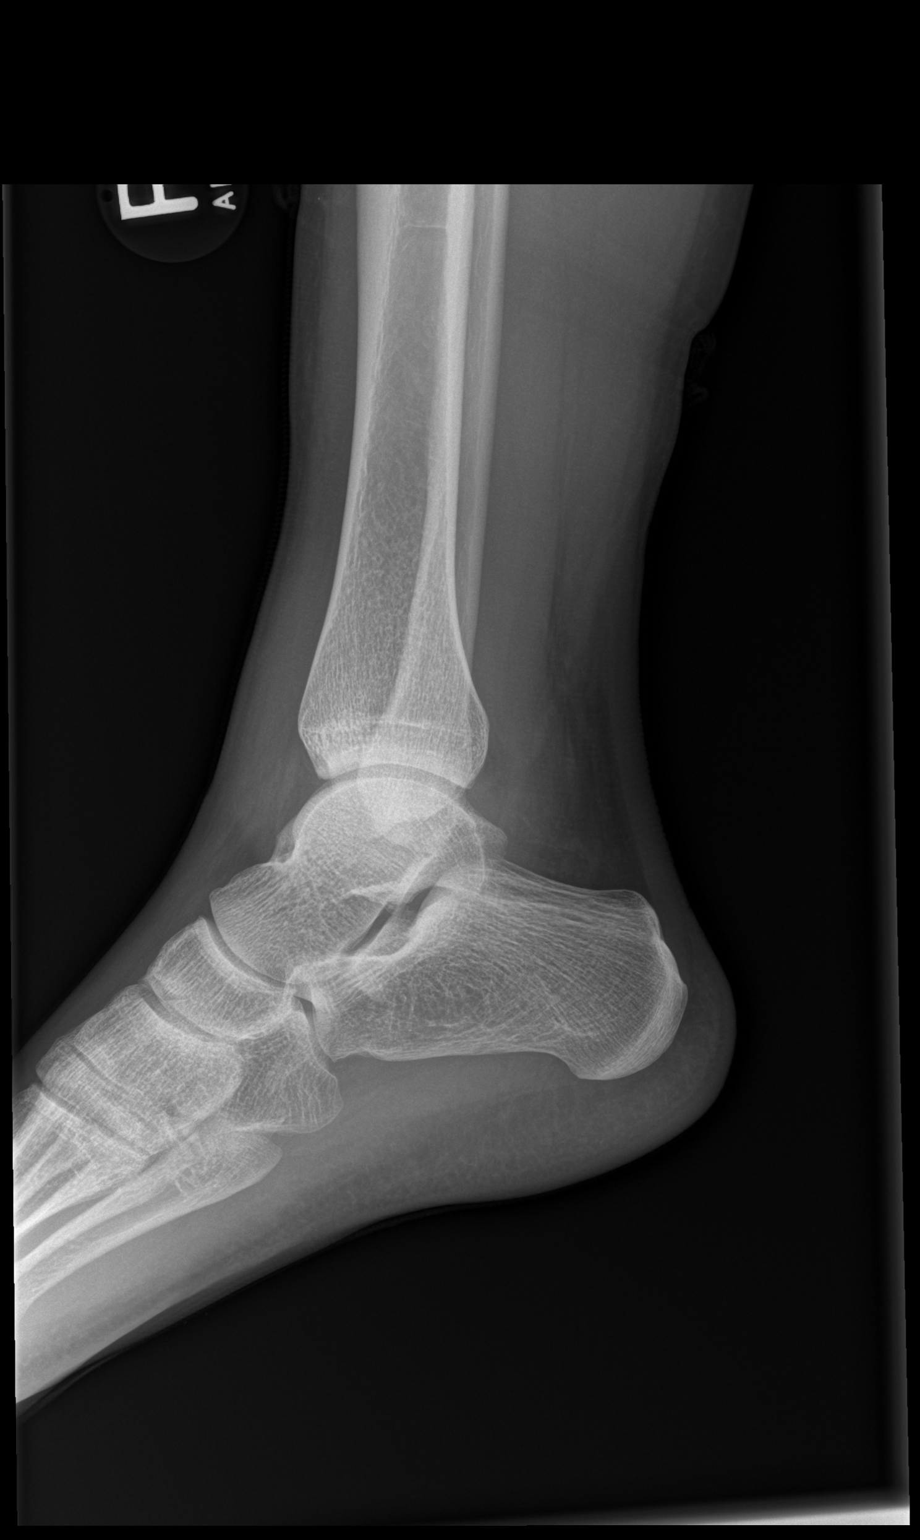

[3 of 3 positions shown; findings below may reference images not displayed]

FINDINGS: There is no evidence of fracture, dislocation, or joint effusion.
There is no evidence of arthropathy or other focal bone abnormality.
Lateral soft tissue edema.
IMPRESSION: Lateral soft tissue edema without fracture or dislocation.

## 2019-10-05 DIAGNOSIS — B078 Other viral warts: Secondary | ICD-10-CM | POA: Diagnosis not present

## 2019-10-05 DIAGNOSIS — L0889 Other specified local infections of the skin and subcutaneous tissue: Secondary | ICD-10-CM | POA: Diagnosis not present

## 2019-10-05 DIAGNOSIS — L538 Other specified erythematous conditions: Secondary | ICD-10-CM | POA: Diagnosis not present

## 2019-10-05 DIAGNOSIS — D1801 Hemangioma of skin and subcutaneous tissue: Secondary | ICD-10-CM | POA: Diagnosis not present

## 2019-11-09 DIAGNOSIS — F339 Major depressive disorder, recurrent, unspecified: Secondary | ICD-10-CM | POA: Diagnosis not present

## 2019-11-09 DIAGNOSIS — R430 Anosmia: Secondary | ICD-10-CM | POA: Diagnosis not present

## 2019-11-09 DIAGNOSIS — Z2821 Immunization not carried out because of patient refusal: Secondary | ICD-10-CM | POA: Diagnosis not present

## 2019-12-22 DIAGNOSIS — L7211 Pilar cyst: Secondary | ICD-10-CM | POA: Diagnosis not present

## 2019-12-22 DIAGNOSIS — L538 Other specified erythematous conditions: Secondary | ICD-10-CM | POA: Diagnosis not present

## 2019-12-22 DIAGNOSIS — L0889 Other specified local infections of the skin and subcutaneous tissue: Secondary | ICD-10-CM | POA: Diagnosis not present

## 2019-12-22 DIAGNOSIS — D1801 Hemangioma of skin and subcutaneous tissue: Secondary | ICD-10-CM | POA: Diagnosis not present

## 2019-12-22 DIAGNOSIS — B078 Other viral warts: Secondary | ICD-10-CM | POA: Diagnosis not present

## 2020-01-17 DIAGNOSIS — F411 Generalized anxiety disorder: Secondary | ICD-10-CM | POA: Diagnosis not present

## 2020-01-25 DIAGNOSIS — L538 Other specified erythematous conditions: Secondary | ICD-10-CM | POA: Diagnosis not present

## 2020-01-25 DIAGNOSIS — B078 Other viral warts: Secondary | ICD-10-CM | POA: Diagnosis not present

## 2020-01-25 DIAGNOSIS — R238 Other skin changes: Secondary | ICD-10-CM | POA: Diagnosis not present

## 2020-01-26 DIAGNOSIS — R2689 Other abnormalities of gait and mobility: Secondary | ICD-10-CM | POA: Diagnosis not present

## 2020-01-26 DIAGNOSIS — M542 Cervicalgia: Secondary | ICD-10-CM | POA: Diagnosis not present

## 2020-01-27 DIAGNOSIS — F411 Generalized anxiety disorder: Secondary | ICD-10-CM | POA: Diagnosis not present

## 2020-01-27 DIAGNOSIS — R2689 Other abnormalities of gait and mobility: Secondary | ICD-10-CM | POA: Diagnosis not present

## 2020-01-27 DIAGNOSIS — M542 Cervicalgia: Secondary | ICD-10-CM | POA: Diagnosis not present

## 2020-01-31 DIAGNOSIS — R2689 Other abnormalities of gait and mobility: Secondary | ICD-10-CM | POA: Diagnosis not present

## 2020-01-31 DIAGNOSIS — M542 Cervicalgia: Secondary | ICD-10-CM | POA: Diagnosis not present

## 2020-02-01 DIAGNOSIS — F411 Generalized anxiety disorder: Secondary | ICD-10-CM | POA: Diagnosis not present

## 2020-02-03 DIAGNOSIS — M542 Cervicalgia: Secondary | ICD-10-CM | POA: Diagnosis not present

## 2020-02-03 DIAGNOSIS — R2689 Other abnormalities of gait and mobility: Secondary | ICD-10-CM | POA: Diagnosis not present

## 2020-02-07 DIAGNOSIS — M542 Cervicalgia: Secondary | ICD-10-CM | POA: Diagnosis not present

## 2020-02-07 DIAGNOSIS — M79662 Pain in left lower leg: Secondary | ICD-10-CM | POA: Diagnosis not present

## 2020-02-07 DIAGNOSIS — R2689 Other abnormalities of gait and mobility: Secondary | ICD-10-CM | POA: Diagnosis not present

## 2020-02-08 DIAGNOSIS — F411 Generalized anxiety disorder: Secondary | ICD-10-CM | POA: Diagnosis not present

## 2020-02-09 DIAGNOSIS — M542 Cervicalgia: Secondary | ICD-10-CM | POA: Diagnosis not present

## 2020-02-09 DIAGNOSIS — R2689 Other abnormalities of gait and mobility: Secondary | ICD-10-CM | POA: Diagnosis not present

## 2020-02-12 DIAGNOSIS — F4310 Post-traumatic stress disorder, unspecified: Secondary | ICD-10-CM | POA: Diagnosis not present

## 2020-02-12 DIAGNOSIS — F1210 Cannabis abuse, uncomplicated: Secondary | ICD-10-CM | POA: Diagnosis not present

## 2020-02-12 DIAGNOSIS — F411 Generalized anxiety disorder: Secondary | ICD-10-CM | POA: Diagnosis not present

## 2020-02-12 DIAGNOSIS — R45851 Suicidal ideations: Secondary | ICD-10-CM | POA: Diagnosis not present

## 2020-02-12 DIAGNOSIS — G8929 Other chronic pain: Secondary | ICD-10-CM | POA: Diagnosis not present

## 2020-02-12 DIAGNOSIS — Z20822 Contact with and (suspected) exposure to covid-19: Secondary | ICD-10-CM | POA: Diagnosis not present

## 2020-02-12 DIAGNOSIS — F1010 Alcohol abuse, uncomplicated: Secondary | ICD-10-CM | POA: Diagnosis not present

## 2020-02-12 DIAGNOSIS — F341 Dysthymic disorder: Secondary | ICD-10-CM | POA: Diagnosis not present

## 2020-02-12 DIAGNOSIS — F1390 Sedative, hypnotic, or anxiolytic use, unspecified, uncomplicated: Secondary | ICD-10-CM | POA: Diagnosis not present

## 2020-02-12 DIAGNOSIS — F39 Unspecified mood [affective] disorder: Secondary | ICD-10-CM | POA: Diagnosis not present

## 2020-02-12 DIAGNOSIS — F17210 Nicotine dependence, cigarettes, uncomplicated: Secondary | ICD-10-CM | POA: Diagnosis not present

## 2020-02-12 DIAGNOSIS — F4010 Social phobia, unspecified: Secondary | ICD-10-CM | POA: Diagnosis not present

## 2020-02-12 DIAGNOSIS — F410 Panic disorder [episodic paroxysmal anxiety] without agoraphobia: Secondary | ICD-10-CM | POA: Diagnosis not present

## 2020-02-12 DIAGNOSIS — Z554 Educational maladjustment and discord with teachers and classmates: Secondary | ICD-10-CM | POA: Diagnosis not present

## 2020-02-12 DIAGNOSIS — F329 Major depressive disorder, single episode, unspecified: Secondary | ICD-10-CM | POA: Diagnosis not present

## 2020-02-12 DIAGNOSIS — F909 Attention-deficit hyperactivity disorder, unspecified type: Secondary | ICD-10-CM | POA: Diagnosis not present

## 2020-02-14 DIAGNOSIS — F1210 Cannabis abuse, uncomplicated: Secondary | ICD-10-CM | POA: Diagnosis not present

## 2020-02-14 DIAGNOSIS — F39 Unspecified mood [affective] disorder: Secondary | ICD-10-CM | POA: Diagnosis not present

## 2020-02-14 DIAGNOSIS — F329 Major depressive disorder, single episode, unspecified: Secondary | ICD-10-CM | POA: Diagnosis not present

## 2020-02-14 DIAGNOSIS — R45851 Suicidal ideations: Secondary | ICD-10-CM | POA: Diagnosis not present

## 2020-02-21 DIAGNOSIS — M79662 Pain in left lower leg: Secondary | ICD-10-CM | POA: Diagnosis not present

## 2020-02-23 DIAGNOSIS — R2689 Other abnormalities of gait and mobility: Secondary | ICD-10-CM | POA: Diagnosis not present

## 2020-02-23 DIAGNOSIS — M542 Cervicalgia: Secondary | ICD-10-CM | POA: Diagnosis not present

## 2020-02-24 DIAGNOSIS — M542 Cervicalgia: Secondary | ICD-10-CM | POA: Diagnosis not present

## 2020-02-24 DIAGNOSIS — R2689 Other abnormalities of gait and mobility: Secondary | ICD-10-CM | POA: Diagnosis not present

## 2020-02-28 DIAGNOSIS — M542 Cervicalgia: Secondary | ICD-10-CM | POA: Diagnosis not present

## 2020-02-28 DIAGNOSIS — R2689 Other abnormalities of gait and mobility: Secondary | ICD-10-CM | POA: Diagnosis not present

## 2020-03-01 DIAGNOSIS — R2689 Other abnormalities of gait and mobility: Secondary | ICD-10-CM | POA: Diagnosis not present

## 2020-03-01 DIAGNOSIS — M542 Cervicalgia: Secondary | ICD-10-CM | POA: Diagnosis not present

## 2020-03-06 DIAGNOSIS — M542 Cervicalgia: Secondary | ICD-10-CM | POA: Diagnosis not present

## 2020-03-06 DIAGNOSIS — R238 Other skin changes: Secondary | ICD-10-CM | POA: Diagnosis not present

## 2020-03-06 DIAGNOSIS — B078 Other viral warts: Secondary | ICD-10-CM | POA: Diagnosis not present

## 2020-03-06 DIAGNOSIS — R2689 Other abnormalities of gait and mobility: Secondary | ICD-10-CM | POA: Diagnosis not present

## 2020-03-06 DIAGNOSIS — L538 Other specified erythematous conditions: Secondary | ICD-10-CM | POA: Diagnosis not present

## 2020-03-08 DIAGNOSIS — M542 Cervicalgia: Secondary | ICD-10-CM | POA: Diagnosis not present

## 2020-03-08 DIAGNOSIS — R2689 Other abnormalities of gait and mobility: Secondary | ICD-10-CM | POA: Diagnosis not present

## 2020-03-23 DIAGNOSIS — F411 Generalized anxiety disorder: Secondary | ICD-10-CM | POA: Diagnosis not present

## 2020-03-23 DIAGNOSIS — F1210 Cannabis abuse, uncomplicated: Secondary | ICD-10-CM | POA: Diagnosis not present

## 2020-03-23 DIAGNOSIS — F331 Major depressive disorder, recurrent, moderate: Secondary | ICD-10-CM | POA: Diagnosis not present

## 2020-03-23 DIAGNOSIS — F4010 Social phobia, unspecified: Secondary | ICD-10-CM | POA: Diagnosis not present

## 2020-03-27 DIAGNOSIS — F331 Major depressive disorder, recurrent, moderate: Secondary | ICD-10-CM | POA: Diagnosis not present

## 2020-03-27 DIAGNOSIS — R45851 Suicidal ideations: Secondary | ICD-10-CM | POA: Diagnosis not present

## 2020-03-27 DIAGNOSIS — F4010 Social phobia, unspecified: Secondary | ICD-10-CM | POA: Diagnosis not present

## 2020-03-27 DIAGNOSIS — F1290 Cannabis use, unspecified, uncomplicated: Secondary | ICD-10-CM | POA: Diagnosis not present

## 2020-03-27 DIAGNOSIS — F1390 Sedative, hypnotic, or anxiolytic use, unspecified, uncomplicated: Secondary | ICD-10-CM | POA: Diagnosis not present

## 2020-03-27 DIAGNOSIS — F411 Generalized anxiety disorder: Secondary | ICD-10-CM | POA: Diagnosis not present

## 2020-03-27 DIAGNOSIS — Z720 Tobacco use: Secondary | ICD-10-CM | POA: Diagnosis not present

## 2020-03-28 DIAGNOSIS — F411 Generalized anxiety disorder: Secondary | ICD-10-CM | POA: Diagnosis not present

## 2020-03-28 DIAGNOSIS — Z720 Tobacco use: Secondary | ICD-10-CM | POA: Diagnosis not present

## 2020-03-28 DIAGNOSIS — R45851 Suicidal ideations: Secondary | ICD-10-CM | POA: Diagnosis not present

## 2020-03-28 DIAGNOSIS — F1390 Sedative, hypnotic, or anxiolytic use, unspecified, uncomplicated: Secondary | ICD-10-CM | POA: Diagnosis not present

## 2020-03-28 DIAGNOSIS — F4010 Social phobia, unspecified: Secondary | ICD-10-CM | POA: Diagnosis not present

## 2020-03-28 DIAGNOSIS — F1290 Cannabis use, unspecified, uncomplicated: Secondary | ICD-10-CM | POA: Diagnosis not present

## 2020-03-28 DIAGNOSIS — F331 Major depressive disorder, recurrent, moderate: Secondary | ICD-10-CM | POA: Diagnosis not present

## 2020-03-30 DIAGNOSIS — F1390 Sedative, hypnotic, or anxiolytic use, unspecified, uncomplicated: Secondary | ICD-10-CM | POA: Diagnosis not present

## 2020-03-30 DIAGNOSIS — F331 Major depressive disorder, recurrent, moderate: Secondary | ICD-10-CM | POA: Diagnosis not present

## 2020-03-30 DIAGNOSIS — F4010 Social phobia, unspecified: Secondary | ICD-10-CM | POA: Diagnosis not present

## 2020-03-30 DIAGNOSIS — R45851 Suicidal ideations: Secondary | ICD-10-CM | POA: Diagnosis not present

## 2020-03-30 DIAGNOSIS — Z720 Tobacco use: Secondary | ICD-10-CM | POA: Diagnosis not present

## 2020-03-30 DIAGNOSIS — F411 Generalized anxiety disorder: Secondary | ICD-10-CM | POA: Diagnosis not present

## 2020-03-30 DIAGNOSIS — F1290 Cannabis use, unspecified, uncomplicated: Secondary | ICD-10-CM | POA: Diagnosis not present

## 2020-04-10 DIAGNOSIS — Z720 Tobacco use: Secondary | ICD-10-CM | POA: Diagnosis not present

## 2020-04-10 DIAGNOSIS — F411 Generalized anxiety disorder: Secondary | ICD-10-CM | POA: Diagnosis not present

## 2020-04-10 DIAGNOSIS — F1390 Sedative, hypnotic, or anxiolytic use, unspecified, uncomplicated: Secondary | ICD-10-CM | POA: Diagnosis not present

## 2020-04-10 DIAGNOSIS — F4010 Social phobia, unspecified: Secondary | ICD-10-CM | POA: Diagnosis not present

## 2020-04-10 DIAGNOSIS — F1290 Cannabis use, unspecified, uncomplicated: Secondary | ICD-10-CM | POA: Diagnosis not present

## 2020-04-10 DIAGNOSIS — R45851 Suicidal ideations: Secondary | ICD-10-CM | POA: Diagnosis not present

## 2020-04-10 DIAGNOSIS — F331 Major depressive disorder, recurrent, moderate: Secondary | ICD-10-CM | POA: Diagnosis not present

## 2020-05-03 DIAGNOSIS — F411 Generalized anxiety disorder: Secondary | ICD-10-CM | POA: Diagnosis not present

## 2020-05-03 DIAGNOSIS — F902 Attention-deficit hyperactivity disorder, combined type: Secondary | ICD-10-CM | POA: Diagnosis not present

## 2020-05-03 DIAGNOSIS — F331 Major depressive disorder, recurrent, moderate: Secondary | ICD-10-CM | POA: Diagnosis not present

## 2020-05-03 DIAGNOSIS — F4010 Social phobia, unspecified: Secondary | ICD-10-CM | POA: Diagnosis not present

## 2020-08-14 ENCOUNTER — Ambulatory Visit: Payer: 59 | Admitting: Internal Medicine

## 2020-08-21 DIAGNOSIS — B078 Other viral warts: Secondary | ICD-10-CM | POA: Diagnosis not present

## 2020-08-21 DIAGNOSIS — D225 Melanocytic nevi of trunk: Secondary | ICD-10-CM | POA: Diagnosis not present

## 2020-08-21 DIAGNOSIS — L720 Epidermal cyst: Secondary | ICD-10-CM | POA: Diagnosis not present

## 2020-08-21 DIAGNOSIS — L814 Other melanin hyperpigmentation: Secondary | ICD-10-CM | POA: Diagnosis not present

## 2020-08-21 DIAGNOSIS — Z808 Family history of malignant neoplasm of other organs or systems: Secondary | ICD-10-CM | POA: Diagnosis not present

## 2020-08-21 DIAGNOSIS — R238 Other skin changes: Secondary | ICD-10-CM | POA: Diagnosis not present

## 2020-09-24 ENCOUNTER — Other Ambulatory Visit: Payer: Self-pay

## 2020-09-24 ENCOUNTER — Encounter: Payer: Self-pay | Admitting: Internal Medicine

## 2020-09-24 ENCOUNTER — Ambulatory Visit (INDEPENDENT_AMBULATORY_CARE_PROVIDER_SITE_OTHER): Payer: BC Managed Care – PPO | Admitting: Internal Medicine

## 2020-09-24 DIAGNOSIS — F1911 Other psychoactive substance abuse, in remission: Secondary | ICD-10-CM

## 2020-09-24 DIAGNOSIS — F332 Major depressive disorder, recurrent severe without psychotic features: Secondary | ICD-10-CM

## 2020-09-24 DIAGNOSIS — F419 Anxiety disorder, unspecified: Secondary | ICD-10-CM | POA: Diagnosis not present

## 2020-09-24 MED ORDER — FLUOXETINE HCL 10 MG PO CAPS: 30.0000 mg | ORAL_CAPSULE | Freq: Every day | ORAL | 5 refills | Status: DC

## 2020-09-24 MED ORDER — BUSPIRONE HCL 30 MG PO TABS: 15.0000 mg | ORAL_TABLET | Freq: Two times a day (BID) | ORAL | 5 refills | Status: DC

## 2020-09-24 MED ORDER — CLONAZEPAM 1 MG PO TABS: 1.0000 mg | ORAL_TABLET | Freq: Two times a day (BID) | ORAL | 1 refills | Status: DC | PRN

## 2020-09-24 NOTE — Assessment & Plan Note (Addendum)
Worse Restart fluoxetine, clonazepam as needed, BuSpar Obtain lab work. I attest the patient start exercising again.  He will continue to see his counselor [facility]

## 2020-09-24 NOTE — Assessment & Plan Note (Addendum)
Worse.  Restart fluoxetine, clonazepam as needed, BuSpar Obtain lab work

## 2020-09-24 NOTE — Progress Notes (Signed)
Subjective:  Patient ID: Jay Stevenson, male    DOB: Apr 24, 1991  Age: 29 y.o. MRN: [id number]  CC: New Patient (Initial Visit)   HPI Jay Stevenson presents for anxiety, anger, depression. Out of meds for a while.  He has not been doing too well since he was out of his meds with increased anxiety, panic attacks, fear of leaving home.  He has not been suicidal or homicidal. He was taking Prozac, Buspar and Clonazepam in the recent past.  Outpatient Medications Prior to Visit  Medication Sig Dispense Refill  . cholecalciferol (VITAMIN D) 1000 UNITS tablet Take 8,000 Units by mouth daily.    . meloxicam (MOBIC) 15 MG tablet Take 1 tablet (15 mg total) by mouth daily. 30 tablet 0  . Omega-3 Fatty Acids (FISH OIL PO) Take 1 capsule by mouth daily.    . Zinc Sulfate (ZINC 15 PO) Take 1 tablet by mouth daily.    . busPIRone (BUSPAR) 30 MG tablet Take 15 mg by mouth 2 (two) times daily. (Patient not taking: Reported on 09/24/2020)    . clonazePAM (KLONOPIN) 1 MG tablet Take 1 mg by mouth 2 (two) times daily. (Patient not taking: Reported on 09/24/2020)    . FLUoxetine (PROZAC) 10 MG capsule Take 30 mg by mouth daily. (Patient not taking: Reported on 09/24/2020)    . hydrocortisone (ANUSOL-HC) 25 MG suppository Place 1 suppository (25 mg total) rectally 2 (two) times daily. (Patient not taking: Reported on 09/24/2020) 12 suppository 0  . MYORISAN 40 MG capsule Take 3 capsules by mouth daily.  (Patient not taking: Reported on 09/24/2020)  0   No facility-administered medications prior to visit.    ROS: Review of Systems  Constitutional: Negative for appetite change, fatigue and unexpected weight change.  HENT: Negative for congestion, nosebleeds, sneezing, sore throat and trouble swallowing.   Eyes: Negative for itching and visual disturbance.  Respiratory: Negative for cough.   Cardiovascular: Negative for chest pain, palpitations and leg swelling.  Gastrointestinal:  Negative for abdominal distention, blood in stool, diarrhea and nausea.  Genitourinary: Negative for frequency and hematuria.  Musculoskeletal: Negative for back pain, gait problem, joint swelling and neck pain.  Skin: Negative for rash.  Neurological: Negative for dizziness, tremors, speech difficulty and weakness.  Psychiatric/Behavioral: Negative for agitation, dysphoric mood, sleep disturbance and suicidal ideas. The patient is not nervous/anxious.     Objective:  BP 120/70   Pulse 79   Temp 98.3 F (36.8 C) (Oral)   Ht 6\' 1"  (1.854 m)   Wt 177 lb (80.3 kg)   SpO2 97%   BMI 23.35 kg/m   BP Readings from Last 3 Encounters:  09/24/20 120/70  06/13/18 130/87  12/02/16 128/80    Wt Readings from Last 3 Encounters:  09/24/20 177 lb (80.3 kg)  06/13/18 185 lb (83.9 kg)  12/02/16 171 lb (77.6 kg)    Physical Exam Constitutional:      General: He is not in acute distress.    Appearance: He is well-developed.     Comments: NAD  HENT:     Mouth/Throat:     Mouth: Oropharynx is clear and moist.  Eyes:     Conjunctiva/sclera: Conjunctivae normal.     Pupils: Pupils are equal, round, and reactive to light.  Neck:     Thyroid: No thyromegaly.     Vascular: No JVD.  Cardiovascular:     Rate and Rhythm: Normal rate and regular rhythm.  Pulses: Intact distal pulses.     Heart sounds: Normal heart sounds. No murmur heard. No friction rub. No gallop.   Pulmonary:     Effort: Pulmonary effort is normal. No respiratory distress.     Breath sounds: Normal breath sounds. No wheezing or rales.  Chest:     Chest wall: No tenderness.  Abdominal:     General: Bowel sounds are normal. There is no distension.     Palpations: Abdomen is soft. There is no mass.     Tenderness: There is no abdominal tenderness. There is no guarding or rebound.  Musculoskeletal:        General: Tenderness present. No edema. Normal range of motion.     Cervical back: Normal range of motion.   Lymphadenopathy:     Cervical: No cervical adenopathy.  Skin:    General: Skin is warm and dry.     Findings: No rash.  Neurological:     Mental Status: He is alert and oriented to person, place, and time.     Cranial Nerves: No cranial nerve deficit.     Motor: No abnormal muscle tone.     Coordination: He displays a negative Romberg sign. Coordination normal.     Gait: Gait normal.     Deep Tendon Reflexes: Reflexes are normal and symmetric.  Psychiatric:        Mood and Affect: Mood and affect normal.        Behavior: Behavior normal.        Thought Content: Thought content normal.        Judgment: Judgment normal.     Lab Results  Component Value Date   WBC 4.1 12/28/2013   HGB 14.9 12/28/2013   HCT 41.7 12/28/2013   PLT 317 12/28/2013   GLUCOSE 99 12/28/2013   CHOL 161 06/29/2013   TRIG 117 06/29/2013   HDL 45 06/29/2013   LDLCALC 93 06/29/2013   ALT 17 12/28/2013   AST 25 12/28/2013   NA 139 12/28/2013   K 3.8 12/28/2013   CL 103 12/28/2013   CREATININE 0.92 12/28/2013   BUN 15 12/28/2013   CO2 24 12/28/2013   TSH 4.547 (H) 06/29/2013   HGBA1C  04/05/2010    5.0 (NOTE)                                                                       According to the ADA Clinical Practice Recommendations for 2011, when HbA1c is used as a screening test:   >=6.5%   Diagnostic of Diabetes Mellitus           (if abnormal result  is confirmed)  5.7-6.4%   Increased risk of developing Diabetes Mellitus  References:Diagnosis and Classification of Diabetes Mellitus,Diabetes Care,2011,34(Suppl 1):S62-S69 and Standards of Medical Care in         Diabetes - 2011,Diabetes Care,2011,34  (Suppl 1):S11-S61.    DG Ankle Complete Right  Result Date: 06/13/2018 CLINICAL DATA:  Right ankle pain and swelling after injury. EXAM: RIGHT ANKLE - COMPLETE 3+ VIEW COMPARISON:  None. FINDINGS: There is no evidence of fracture, dislocation, or joint effusion. There is no evidence of arthropathy or  other focal bone abnormality. Lateral soft tissue edema. IMPRESSION: Lateral soft  tissue edema without fracture or dislocation. Electronically Signed   By: Narda Rutherford M.D.   On: 06/13/2018 06:41    Assessment & Plan:     Follow-up: No follow-ups on file.  Sonda Primes, MD

## 2020-09-24 NOTE — Patient Instructions (Signed)
You can try Lion's Mane Mushroom capsules for memory, focus, anxiety ( [employer])

## 2020-09-25 DIAGNOSIS — B078 Other viral warts: Secondary | ICD-10-CM | POA: Diagnosis not present

## 2020-09-25 DIAGNOSIS — D2261 Melanocytic nevi of right upper limb, including shoulder: Secondary | ICD-10-CM | POA: Diagnosis not present

## 2020-09-25 DIAGNOSIS — R238 Other skin changes: Secondary | ICD-10-CM | POA: Diagnosis not present

## 2020-09-25 DIAGNOSIS — D225 Melanocytic nevi of trunk: Secondary | ICD-10-CM | POA: Diagnosis not present

## 2020-09-26 ENCOUNTER — Encounter: Payer: Self-pay | Admitting: Internal Medicine

## 2020-09-26 NOTE — Assessment & Plan Note (Signed)
The patient is denying using drugs or alcohol

## 2020-10-01 ENCOUNTER — Other Ambulatory Visit (INDEPENDENT_AMBULATORY_CARE_PROVIDER_SITE_OTHER): Payer: BC Managed Care – PPO

## 2020-10-01 ENCOUNTER — Other Ambulatory Visit: Payer: Self-pay

## 2020-10-01 DIAGNOSIS — F332 Major depressive disorder, recurrent severe without psychotic features: Secondary | ICD-10-CM | POA: Diagnosis not present

## 2020-10-01 DIAGNOSIS — F419 Anxiety disorder, unspecified: Secondary | ICD-10-CM | POA: Diagnosis not present

## 2020-10-01 LAB — CBC WITH DIFFERENTIAL/PLATELET
Basophils Absolute: 0 10*3/uL (ref 0.0–0.1)
Basophils Relative: 0.6 % (ref 0.0–3.0)
Eosinophils Absolute: 0.3 10*3/uL (ref 0.0–0.7)
Eosinophils Relative: 4.3 % (ref 0.0–5.0)
HCT: 43 % (ref 39.0–52.0)
Hemoglobin: 14.9 g/dL (ref 13.0–17.0)
Lymphocytes Relative: 43.6 % (ref 12.0–46.0)
Lymphs Abs: 3 10*3/uL (ref 0.7–4.0)
MCHC: 34.7 g/dL (ref 30.0–36.0)
MCV: 92.6 fl (ref 78.0–100.0)
Monocytes Absolute: 0.6 10*3/uL (ref 0.1–1.0)
Monocytes Relative: 8.9 % (ref 3.0–12.0)
Neutro Abs: 2.9 10*3/uL (ref 1.4–7.7)
Neutrophils Relative %: 42.6 % — ABNORMAL LOW (ref 43.0–77.0)
Platelets: 312 10*3/uL (ref 150.0–400.0)
RBC: 4.65 Mil/uL (ref 4.22–5.81)
RDW: 13 % (ref 11.5–15.5)
WBC: 6.9 10*3/uL (ref 4.0–10.5)

## 2020-10-01 LAB — COMPREHENSIVE METABOLIC PANEL
ALT: 15 U/L (ref 0–53)
AST: 19 U/L (ref 0–37)
Albumin: 4.4 g/dL (ref 3.5–5.2)
Alkaline Phosphatase: 69 U/L (ref 39–117)
BUN: 14 mg/dL (ref 6–23)
CO2: 28 mEq/L (ref 19–32)
Calcium: 8.9 mg/dL (ref 8.4–10.5)
Chloride: 102 mEq/L (ref 96–112)
Creatinine, Ser: 1.2 mg/dL (ref 0.40–1.50)
GFR: 82.38 mL/min (ref >60.00)
Glucose, Bld: 81 mg/dL (ref 70–99)
Potassium: 3.7 mEq/L (ref 3.5–5.1)
Sodium: 137 mEq/L (ref 135–145)
Total Bilirubin: 0.8 mg/dL (ref 0.2–1.2)
Total Protein: 6.8 g/dL (ref 6.0–8.3)

## 2020-10-01 LAB — TSH: TSH: 5.93 u[IU]/mL — ABNORMAL HIGH (ref 0.35–4.50)

## 2020-10-02 ENCOUNTER — Telehealth: Payer: Self-pay | Admitting: Internal Medicine

## 2020-10-02 ENCOUNTER — Other Ambulatory Visit: Payer: Self-pay | Admitting: Internal Medicine

## 2020-10-02 DIAGNOSIS — F39 Unspecified mood [affective] disorder: Secondary | ICD-10-CM

## 2020-10-02 NOTE — Telephone Encounter (Signed)
Patient calling back to get his lab work results. [phone]

## 2020-10-03 NOTE — Telephone Encounter (Signed)
Spoke w/pt via [employer]. He is aware of results.Marland KitchenRaechel Chute

## 2020-10-10 DIAGNOSIS — F419 Anxiety disorder, unspecified: Secondary | ICD-10-CM | POA: Diagnosis not present

## 2020-10-10 DIAGNOSIS — F410 Panic disorder [episodic paroxysmal anxiety] without agoraphobia: Secondary | ICD-10-CM | POA: Diagnosis not present

## 2020-10-10 DIAGNOSIS — Z79899 Other long term (current) drug therapy: Secondary | ICD-10-CM | POA: Diagnosis not present

## 2020-10-15 ENCOUNTER — Other Ambulatory Visit: Payer: Self-pay | Admitting: Internal Medicine

## 2020-10-15 DIAGNOSIS — F39 Unspecified mood [affective] disorder: Secondary | ICD-10-CM

## 2020-10-15 DIAGNOSIS — F419 Anxiety disorder, unspecified: Secondary | ICD-10-CM

## 2020-10-30 DIAGNOSIS — B078 Other viral warts: Secondary | ICD-10-CM | POA: Diagnosis not present

## 2020-10-30 DIAGNOSIS — D225 Melanocytic nevi of trunk: Secondary | ICD-10-CM | POA: Diagnosis not present

## 2020-10-30 DIAGNOSIS — R238 Other skin changes: Secondary | ICD-10-CM | POA: Diagnosis not present

## 2020-10-30 DIAGNOSIS — R208 Other disturbances of skin sensation: Secondary | ICD-10-CM | POA: Diagnosis not present

## 2020-11-05 ENCOUNTER — Other Ambulatory Visit: Payer: Self-pay

## 2020-11-05 ENCOUNTER — Ambulatory Visit (INDEPENDENT_AMBULATORY_CARE_PROVIDER_SITE_OTHER): Payer: 59 | Admitting: Internal Medicine

## 2020-11-05 ENCOUNTER — Encounter: Payer: Self-pay | Admitting: Internal Medicine

## 2020-11-05 DIAGNOSIS — F332 Major depressive disorder, recurrent severe without psychotic features: Secondary | ICD-10-CM | POA: Diagnosis not present

## 2020-11-05 DIAGNOSIS — Z23 Encounter for immunization: Secondary | ICD-10-CM | POA: Diagnosis not present

## 2020-11-05 DIAGNOSIS — F1011 Alcohol abuse, in remission: Secondary | ICD-10-CM

## 2020-11-05 DIAGNOSIS — F1911 Other psychoactive substance abuse, in remission: Secondary | ICD-10-CM

## 2020-11-05 DIAGNOSIS — F39 Unspecified mood [affective] disorder: Secondary | ICD-10-CM

## 2020-11-05 DIAGNOSIS — F419 Anxiety disorder, unspecified: Secondary | ICD-10-CM | POA: Diagnosis not present

## 2020-11-05 MED ORDER — CLONAZEPAM 1 MG PO TABS: 1.0000 mg | ORAL_TABLET | Freq: Two times a day (BID) | ORAL | 1 refills | Status: DC | PRN

## 2020-11-05 NOTE — Progress Notes (Signed)
Subjective:  Patient ID: Jay Stevenson, male    DOB: [birth year]  Age: 30 y.o. MRN: [id number]  CC: No chief complaint on file.   HPI Jay Stevenson presents for anxiety, panic attacks  He has not started w/councelling yet.  Per ER note on 10/16/20: "Jay Stevenson is a 30 y.o. male who presents to the Emergency Department complaining of anxiety. The patient states that he had drank quite a bit alcohol the night before last and didn't sleep that well tonight smoked some marijuana and after smoking marijuana states that he just felt like he couldn't catch his breath. States that freaked him out started getting territorial and numbness and tingling in his fingers and toes. Describes chest pain in the upper throat. He states there is been no cough nausea or vomiting. He denies any shortness of breath at this time. He took one of his Klonopin's and states that he took that about 30 minutes ago. He denies any fever or cough. No coronavirus contacts. He states that his symptoms feel like he was having palpitations and that his heart rate was racing. No recent travel or trauma."  He denies using drugs or alcohol.  He started exercising.  He has been talking to his dad daily and with his mom every couple weeks. He thinks he is doing better on meds.  Outpatient Medications Prior to Visit  Medication Sig Dispense Refill  . busPIRone (BUSPAR) 30 MG tablet Take 0.5 tablets (15 mg total) by mouth 2 (two) times daily. 30 tablet 5  . cholecalciferol (VITAMIN D) 1000 UNITS tablet Take 8,000 Units by mouth daily.    . clonazePAM (KLONOPIN) 1 MG tablet Take 1 tablet (1 mg total) by mouth 2 (two) times daily as needed for anxiety. 30 tablet 1  . FLUoxetine (PROZAC) 10 MG capsule Take 3 capsules (30 mg total) by mouth daily. 90 capsule 5  . hydrocortisone (ANUSOL-HC) 25 MG suppository Place 1 suppository (25 mg total) rectally 2 (two) times daily. (Patient not taking: Reported on  09/24/2020) 12 suppository 0  . meloxicam (MOBIC) 15 MG tablet Take 1 tablet (15 mg total) by mouth daily. 30 tablet 0  . MYORISAN 40 MG capsule Take 3 capsules by mouth daily.  (Patient not taking: Reported on 09/24/2020)  0  . Omega-3 Fatty Acids (FISH OIL PO) Take 1 capsule by mouth daily.    . Zinc Sulfate (ZINC 15 PO) Take 1 tablet by mouth daily.     No facility-administered medications prior to visit.    ROS: Review of Systems  Constitutional: Negative for appetite change, fatigue and unexpected weight change.  HENT: Negative for congestion, nosebleeds, sneezing, sore throat and trouble swallowing.   Eyes: Negative for itching and visual disturbance.  Respiratory: Negative for cough.   Cardiovascular: Negative for chest pain, palpitations and leg swelling.  Gastrointestinal: Negative for abdominal distention, blood in stool, diarrhea and nausea.  Genitourinary: Negative for frequency and hematuria.  Musculoskeletal: Negative for back pain, gait problem, joint swelling and neck pain.  Skin: Negative for rash.  Neurological: Negative for dizziness, tremors, speech difficulty and weakness.  Psychiatric/Behavioral: Positive for dysphoric mood. Negative for agitation, sleep disturbance and suicidal ideas. The patient is nervous/anxious.     Objective:  There were no vitals taken for this visit.  BP Readings from Last 3 Encounters:  09/24/20 120/70  06/13/18 130/87  12/02/16 128/80    Wt Readings from Last 3 Encounters:  09/24/20 177 lb (80.3  kg)  06/13/18 185 lb (83.9 kg)  12/02/16 171 lb (77.6 kg)    Physical Exam Constitutional:      General: He is not in acute distress.    Appearance: He is well-developed.     Comments: NAD  HENT:     Mouth/Throat:     Mouth: Oropharynx is clear and moist.  Eyes:     Conjunctiva/sclera: Conjunctivae normal.     Pupils: Pupils are equal, round, and reactive to light.  Neck:     Thyroid: No thyromegaly.     Vascular: No JVD.   Cardiovascular:     Rate and Rhythm: Normal rate and regular rhythm.     Pulses: Intact distal pulses.     Heart sounds: Normal heart sounds. No murmur heard. No friction rub. No gallop.   Pulmonary:     Effort: Pulmonary effort is normal. No respiratory distress.     Breath sounds: Normal breath sounds. No wheezing or rales.  Chest:     Chest wall: No tenderness.  Abdominal:     General: Bowel sounds are normal. There is no distension.     Palpations: Abdomen is soft. There is no mass.     Tenderness: There is no abdominal tenderness. There is no guarding or rebound.  Musculoskeletal:        General: No tenderness or edema. Normal range of motion.     Cervical back: Normal range of motion.  Lymphadenopathy:     Cervical: No cervical adenopathy.  Skin:    General: Skin is warm and dry.     Findings: No rash.  Neurological:     Mental Status: He is alert and oriented to person, place, and time.     Cranial Nerves: No cranial nerve deficit.     Motor: No abnormal muscle tone.     Coordination: He displays a negative Romberg sign. Coordination normal.     Gait: Gait normal.     Deep Tendon Reflexes: Reflexes are normal and symmetric.  Psychiatric:        Mood and Affect: Mood and affect and mood normal.        Behavior: Behavior normal.        Thought Content: Thought content normal.        Judgment: Judgment normal.     Lab Results  Component Value Date   WBC 6.9 10/01/2020   HGB 14.9 10/01/2020   HCT 43.0 10/01/2020   PLT 312.0 10/01/2020   GLUCOSE 81 10/01/2020   CHOL 161 06/29/2013   TRIG 117 06/29/2013   HDL 45 06/29/2013   LDLCALC 93 06/29/2013   ALT 15 10/01/2020   AST 19 10/01/2020   NA 137 10/01/2020   K 3.7 10/01/2020   CL 102 10/01/2020   CREATININE 1.20 10/01/2020   BUN 14 10/01/2020   CO2 28 10/01/2020   TSH 5.93 (H) 10/01/2020   HGBA1C  04/05/2010    5.0 (NOTE)                                                                       According to  the [place] Clinical Practice Recommendations for 2011, when HbA1c is used as a screening test:   >=6.5%   Diagnostic  of Diabetes Mellitus           (if abnormal result  is confirmed)  5.7-6.4%   Increased risk of developing Diabetes Mellitus  References:Diagnosis and Classification of Diabetes Mellitus,Diabetes Care,2011,34(Suppl 1):S62-S69 and Standards of Medical Care in         Diabetes - 2011,Diabetes Care,2011,34  (Suppl 1):S11-S61.    DG Ankle Complete Right  Result Date: 06/13/2018 CLINICAL DATA:  Right ankle pain and swelling after injury. EXAM: RIGHT ANKLE - COMPLETE 3+ VIEW COMPARISON:  None. FINDINGS: There is no evidence of fracture, dislocation, or joint effusion. There is no evidence of arthropathy or other focal bone abnormality. Lateral soft tissue edema. IMPRESSION: Lateral soft tissue edema without fracture or dislocation. Electronically Signed   By: Narda Rutherford M.D.   On: 06/13/2018 06:41    Assessment & Plan:    Follow-up: No follow-ups on file.  Sonda Primes, MD

## 2020-11-05 NOTE — Assessment & Plan Note (Addendum)
Cont w/ fluoxetine, clonazepam as needed, BuSpar I adviced the patient start exercising again.  He will continue to see his counselor [facility] Denies using drugs, alcohol He will  see a counselor:  [facility], [facility]  virtual appointments - [phone].

## 2020-11-05 NOTE — Assessment & Plan Note (Signed)
Denies using drugs, alcohol

## 2020-11-05 NOTE — Assessment & Plan Note (Addendum)
Cont w/ fluoxetine, clonazepam as needed, BuSpar Try [facility] He will  see a counselor:  [facility]  virtual appointments - [phone].

## 2020-11-05 NOTE — Patient Instructions (Signed)
You can try Lion's Mane Mushroom capsules for memory, focus, neuropathy ( [employer])    

## 2020-11-05 NOTE — Assessment & Plan Note (Signed)
He drank prior to his panic attack on 10/16/2020.  No alcohol since then per patient

## 2020-11-10 DIAGNOSIS — W274XXA Contact with kitchen utensil, initial encounter: Secondary | ICD-10-CM | POA: Diagnosis not present

## 2020-11-10 DIAGNOSIS — S61211A Laceration without foreign body of left index finger without damage to nail, initial encounter: Secondary | ICD-10-CM | POA: Diagnosis not present

## 2020-11-27 DIAGNOSIS — B078 Other viral warts: Secondary | ICD-10-CM | POA: Diagnosis not present

## 2020-11-27 DIAGNOSIS — R208 Other disturbances of skin sensation: Secondary | ICD-10-CM | POA: Diagnosis not present

## 2020-11-27 DIAGNOSIS — R238 Other skin changes: Secondary | ICD-10-CM | POA: Diagnosis not present

## 2020-12-27 ENCOUNTER — Ambulatory Visit (INDEPENDENT_AMBULATORY_CARE_PROVIDER_SITE_OTHER): Payer: BC Managed Care – PPO | Admitting: Internal Medicine

## 2020-12-27 ENCOUNTER — Other Ambulatory Visit: Payer: Self-pay

## 2020-12-27 ENCOUNTER — Encounter: Payer: Self-pay | Admitting: Internal Medicine

## 2020-12-27 DIAGNOSIS — F332 Major depressive disorder, recurrent severe without psychotic features: Secondary | ICD-10-CM

## 2020-12-27 DIAGNOSIS — F39 Unspecified mood [affective] disorder: Secondary | ICD-10-CM

## 2020-12-27 DIAGNOSIS — F419 Anxiety disorder, unspecified: Secondary | ICD-10-CM | POA: Diagnosis not present

## 2020-12-27 DIAGNOSIS — B079 Viral wart, unspecified: Secondary | ICD-10-CM

## 2020-12-27 LAB — CBC WITH DIFFERENTIAL/PLATELET
Basophils Absolute: 0 10*3/uL (ref 0.0–0.1)
Basophils Relative: 0.6 % (ref 0.0–3.0)
Eosinophils Absolute: 0.2 10*3/uL (ref 0.0–0.7)
Eosinophils Relative: 4.2 % (ref 0.0–5.0)
HCT: 43.5 % (ref 39.0–52.0)
Hemoglobin: 15 g/dL (ref 13.0–17.0)
Lymphocytes Relative: 31.3 % (ref 12.0–46.0)
Lymphs Abs: 1.5 10*3/uL (ref 0.7–4.0)
MCHC: 34.6 g/dL (ref 30.0–36.0)
MCV: 92.8 fl (ref 78.0–100.0)
Monocytes Absolute: 0.6 10*3/uL (ref 0.1–1.0)
Monocytes Relative: 13.1 % — ABNORMAL HIGH (ref 3.0–12.0)
Neutro Abs: 2.5 10*3/uL (ref 1.4–7.7)
Neutrophils Relative %: 50.8 % (ref 43.0–77.0)
Platelets: 286 10*3/uL (ref 150.0–400.0)
RBC: 4.68 Mil/uL (ref 4.22–5.81)
RDW: 12.5 % (ref 11.5–15.5)
WBC: 4.9 10*3/uL (ref 4.0–10.5)

## 2020-12-27 LAB — COMPREHENSIVE METABOLIC PANEL
ALT: 18 U/L (ref 0–53)
AST: 23 U/L (ref 0–37)
Albumin: 4.4 g/dL (ref 3.5–5.2)
Alkaline Phosphatase: 87 U/L (ref 39–117)
BUN: 8 mg/dL (ref 6–23)
CO2: 31 mEq/L (ref 19–32)
Calcium: 9.5 mg/dL (ref 8.4–10.5)
Chloride: 103 mEq/L (ref 96–112)
Creatinine, Ser: 0.95 mg/dL (ref 0.40–1.50)
GFR: 108.86 mL/min (ref >60.00)
Glucose, Bld: 83 mg/dL (ref 70–99)
Potassium: 3.8 mEq/L (ref 3.5–5.1)
Sodium: 139 mEq/L (ref 135–145)
Total Bilirubin: 0.4 mg/dL (ref 0.2–1.2)
Total Protein: 7.2 g/dL (ref 6.0–8.3)

## 2020-12-27 LAB — VITAMIN D 25 HYDROXY (VIT D DEFICIENCY, FRACTURES): VITD: 84.76 ng/mL (ref 30.00–100.00)

## 2020-12-27 LAB — VITAMIN B12: Vitamin B-12: 666 pg/mL (ref 211–911)

## 2020-12-27 LAB — T4, FREE: Free T4: 0.84 ng/dL (ref 0.60–1.60)

## 2020-12-27 LAB — TESTOSTERONE: Testosterone: 559.51 ng/dL (ref 300.00–890.00)

## 2020-12-27 LAB — TSH: TSH: 4.81 u[IU]/mL — ABNORMAL HIGH (ref 0.35–4.50)

## 2020-12-27 MED ORDER — CLONAZEPAM 1 MG PO TABS: 1.0000 mg | ORAL_TABLET | Freq: Two times a day (BID) | ORAL | 2 refills | Status: DC | PRN

## 2020-12-27 NOTE — Assessment & Plan Note (Signed)
He will  see a counselor:  [facility], [facility]  virtual appointments - [phone]. [facility]

## 2020-12-27 NOTE — Progress Notes (Signed)
Subjective:  Patient ID: Jay Stevenson, male    DOB: 08/19/1991  Age: 30 y.o. MRN: [id number]  CC: Follow-up (2 months)   HPI [name] presents for anxiety, panic attacks - hard to be off Clonopin even w/ On Vit D 6000 iu a day, Mg Rhet is worried about his facial hair growth.  Has been using Rogaine to improve his facial hair growth. He had his testosterone checked a while ago and was on the low end of normal for his age.  He is worried about it and is asking to have a check.  Outpatient Medications Prior to Visit  Medication Sig Dispense Refill  . busPIRone (BUSPAR) 30 MG tablet Take 0.5 tablets (15 mg total) by mouth 2 (two) times daily. 30 tablet 5  . clonazePAM (KLONOPIN) 1 MG tablet Take 1 tablet (1 mg total) by mouth 2 (two) times daily as needed for anxiety. 30 tablet 1  . FLUoxetine (PROZAC) 10 MG capsule Take 3 capsules (30 mg total) by mouth daily. 90 capsule 5  . Zinc Sulfate (ZINC 15 PO) Take 1 tablet by mouth daily.     No facility-administered medications prior to visit.    ROS: Review of Systems  Constitutional: Negative for appetite change, fatigue and unexpected weight change.  HENT: Negative for congestion, nosebleeds, sneezing, sore throat and trouble swallowing.   Eyes: Negative for itching and visual disturbance.  Respiratory: Negative for cough.   Cardiovascular: Negative for chest pain, palpitations and leg swelling.  Gastrointestinal: Negative for abdominal distention, blood in stool, diarrhea and nausea.  Genitourinary: Negative for frequency and hematuria.  Musculoskeletal: Negative for back pain, gait problem, joint swelling and neck pain.  Skin: Negative for rash.  Neurological: Negative for dizziness, tremors, speech difficulty and weakness.  Psychiatric/Behavioral: Negative for agitation, dysphoric mood, sleep disturbance and suicidal ideas. The patient is not nervous/anxious.     Objective:  BP 138/66 (BP Location:  Right Arm, Patient Position: Sitting, Cuff Size: Normal)   Pulse 73   Temp 97.9 F (36.6 C) (Oral)   Ht 6\' 1"  (1.854 m)   Wt 177 lb 3.2 oz (80.4 kg)   SpO2 98%   BMI 23.38 kg/m   BP Readings from Last 3 Encounters:  12/27/20 138/66  11/05/20 120/78  09/24/20 120/70    Wt Readings from Last 3 Encounters:  12/27/20 177 lb 3.2 oz (80.4 kg)  11/05/20 173 lb 9.6 oz (78.7 kg)  09/24/20 177 lb (80.3 kg)    Physical Exam Constitutional:      General: He is not in acute distress.    Appearance: He is well-developed.     Comments: NAD  Eyes:     Conjunctiva/sclera: Conjunctivae normal.     Pupils: Pupils are equal, round, and reactive to light.  Neck:     Thyroid: No thyromegaly.     Vascular: No JVD.  Cardiovascular:     Rate and Rhythm: Normal rate and regular rhythm.     Heart sounds: Normal heart sounds. No murmur heard. No friction rub. No gallop.   Pulmonary:     Effort: Pulmonary effort is normal. No respiratory distress.     Breath sounds: Normal breath sounds. No wheezing or rales.  Chest:     Chest wall: No tenderness.  Abdominal:     General: Bowel sounds are normal. There is no distension.     Palpations: Abdomen is soft. There is no mass.     Tenderness: There  is no abdominal tenderness. There is no guarding or rebound.  Musculoskeletal:        General: No tenderness. Normal range of motion.     Cervical back: Normal range of motion.  Lymphadenopathy:     Cervical: No cervical adenopathy.  Skin:    General: Skin is warm and dry.     Findings: No rash.  Neurological:     Mental Status: He is alert and oriented to person, place, and time.     Cranial Nerves: No cranial nerve deficit.     Motor: No abnormal muscle tone.     Coordination: Coordination normal.     Gait: Gait normal.     Deep Tendon Reflexes: Reflexes are normal and symmetric.  Psychiatric:        Behavior: Behavior normal.        Thought Content: Thought content normal.        Judgment:  Judgment normal.     Lab Results  Component Value Date   WBC 6.9 10/01/2020   HGB 14.9 10/01/2020   HCT 43.0 10/01/2020   PLT 312.0 10/01/2020   GLUCOSE 81 10/01/2020   CHOL 161 06/29/2013   TRIG 117 06/29/2013   HDL 45 06/29/2013   LDLCALC 93 06/29/2013   ALT 15 10/01/2020   AST 19 10/01/2020   NA 137 10/01/2020   K 3.7 10/01/2020   CL 102 10/01/2020   CREATININE 1.20 10/01/2020   BUN 14 10/01/2020   CO2 28 10/01/2020   TSH 5.93 (H) 10/01/2020   HGBA1C  04/05/2010    5.0 (NOTE)                                                                       According to the ADA Clinical Practice Recommendations for 2011, when HbA1c is used as a screening test:   >=6.5%   Diagnostic of Diabetes Mellitus           (if abnormal result  is confirmed)  5.7-6.4%   Increased risk of developing Diabetes Mellitus  References:Diagnosis and Classification of Diabetes Mellitus,Diabetes Care,2011,34(Suppl 1):S62-S69 and Standards of Medical Care in         Diabetes - 2011,Diabetes Care,2011,34  (Suppl 1):S11-S61.    DG Ankle Complete Right  Result Date: 06/13/2018 CLINICAL DATA:  Right ankle pain and swelling after injury. EXAM: RIGHT ANKLE - COMPLETE 3+ VIEW COMPARISON:  None. FINDINGS: There is no evidence of fracture, dislocation, or joint effusion. There is no evidence of arthropathy or other focal bone abnormality. Lateral soft tissue edema. IMPRESSION: Lateral soft tissue edema without fracture or dislocation. Electronically Signed   By: Narda Rutherford M.D.   On: 06/13/2018 06:41    Assessment & Plan:     Follow-up: No follow-ups on file.  Sonda Primes, MD

## 2020-12-27 NOTE — Patient Instructions (Signed)

## 2020-12-28 LAB — PMP SCREEN PROFILE (10S), URINE
Amphetamine Scrn, Ur: NEGATIVE ng/mL
BARBITURATE SCREEN URINE: NEGATIVE ng/mL
BENZODIAZEPINE SCREEN, URINE: NEGATIVE ng/mL
CANNABINOIDS UR QL SCN: POSITIVE ng/mL — AB
Cocaine (Metab) Scrn, Ur: NEGATIVE ng/mL
Creatinine(Crt), U: 33 mg/dL (ref 20.0–300.0)
Methadone Screen, Urine: NEGATIVE ng/mL
OXYCODONE+OXYMORPHONE UR QL SCN: NEGATIVE ng/mL
Opiate Scrn, Ur: NEGATIVE ng/mL
Ph of Urine: 6.6 (ref 4.5–8.9)
Phencyclidine Qn, Ur: NEGATIVE ng/mL
Propoxyphene Scrn, Ur: NEGATIVE ng/mL

## 2021-01-07 ENCOUNTER — Ambulatory Visit: Payer: BC Managed Care – PPO | Admitting: Internal Medicine

## 2021-02-26 DIAGNOSIS — R208 Other disturbances of skin sensation: Secondary | ICD-10-CM | POA: Diagnosis not present

## 2021-02-26 DIAGNOSIS — R238 Other skin changes: Secondary | ICD-10-CM | POA: Diagnosis not present

## 2021-02-26 DIAGNOSIS — L918 Other hypertrophic disorders of the skin: Secondary | ICD-10-CM | POA: Diagnosis not present

## 2021-02-26 DIAGNOSIS — B078 Other viral warts: Secondary | ICD-10-CM | POA: Diagnosis not present

## 2021-03-05 DIAGNOSIS — L239 Allergic contact dermatitis, unspecified cause: Secondary | ICD-10-CM | POA: Diagnosis not present

## 2021-03-28 ENCOUNTER — Ambulatory Visit (INDEPENDENT_AMBULATORY_CARE_PROVIDER_SITE_OTHER): Payer: BC Managed Care – PPO | Admitting: Internal Medicine

## 2021-03-28 ENCOUNTER — Other Ambulatory Visit: Payer: Self-pay

## 2021-03-28 ENCOUNTER — Encounter: Payer: Self-pay | Admitting: Internal Medicine

## 2021-03-28 VITALS — BP 120/82 | HR 96 | Temp 98.5°F | Ht 73.0 in | Wt 172.0 lb

## 2021-03-28 DIAGNOSIS — F419 Anxiety disorder, unspecified: Secondary | ICD-10-CM

## 2021-03-28 DIAGNOSIS — F332 Major depressive disorder, recurrent severe without psychotic features: Secondary | ICD-10-CM

## 2021-03-28 DIAGNOSIS — F39 Unspecified mood [affective] disorder: Secondary | ICD-10-CM

## 2021-03-28 MED ORDER — TRIAMCINOLONE ACETONIDE 0.5 % EX CREA: 1.0000 "application " | TOPICAL_CREAM | Freq: Three times a day (TID) | CUTANEOUS | 1 refills | Status: AC | PRN

## 2021-03-28 MED ORDER — ZOLPIDEM TARTRATE 10 MG PO TABS: 5.0000 mg | ORAL_TABLET | Freq: Every evening | ORAL | 1 refills | Status: DC | PRN

## 2021-03-28 MED ORDER — CLONAZEPAM 1 MG PO TABS: 1.0000 mg | ORAL_TABLET | Freq: Two times a day (BID) | ORAL | 2 refills | Status: DC | PRN

## 2021-03-28 NOTE — Assessment & Plan Note (Addendum)
She will try [employer]'s main Is still looking for a counselor.  She is planning to start college

## 2021-03-28 NOTE — Progress Notes (Signed)
Subjective:  Patient ID: Jay Stevenson, male    DOB: 1991/05/09  Age: 30 y.o. MRN: [id number]  CC: Follow-up (3 month f/u)   HPI Jay Stevenson presents for rash, anxiety, insomnia.  In the past she was given a prescription for Ambien that helps him. He is concerned about his testosterone levels and is worried about his youthful looks and lack of coarse facial hair growth. He denies drinking alcohol, using drugs  Outpatient Medications Prior to Visit  Medication Sig Dispense Refill   clonazePAM (KLONOPIN) 1 MG tablet Take 1 tablet (1 mg total) by mouth 2 (two) times daily as needed for anxiety. 30 tablet 2   busPIRone (BUSPAR) 30 MG tablet Take 0.5 tablets (15 mg total) by mouth 2 (two) times daily. (Patient not taking: Reported on 03/28/2021) 30 tablet 5   Zinc Sulfate (ZINC 15 PO) Take 1 tablet by mouth daily.     FLUoxetine (PROZAC) 10 MG capsule Take 3 capsules (30 mg total) by mouth daily. (Patient not taking: Reported on 03/28/2021) 90 capsule 5   No facility-administered medications prior to visit.    ROS: Review of Systems  Constitutional:  Negative for appetite change, fatigue and unexpected weight change.  HENT:  Negative for congestion, nosebleeds, sneezing, sore throat and trouble swallowing.   Eyes:  Negative for itching and visual disturbance.  Respiratory:  Negative for cough.   Cardiovascular:  Negative for chest pain, palpitations and leg swelling.  Gastrointestinal:  Negative for abdominal distention, blood in stool, diarrhea and nausea.  Genitourinary:  Negative for frequency and hematuria.  Musculoskeletal:  Negative for back pain, gait problem, joint swelling and neck pain.  Skin:  Negative for rash.  Neurological:  Negative for dizziness, tremors, speech difficulty and weakness.  Psychiatric/Behavioral:  Positive for dysphoric mood and sleep disturbance. Negative for agitation and suicidal ideas. The patient is nervous/anxious.    Objective:   BP 120/82 (BP Location: Left Arm)   Pulse 96   Temp 98.5 F (36.9 C) (Oral)   Ht 6\' 1"  (1.854 m)   Wt 172 lb (78 kg)   SpO2 96%   BMI 22.69 kg/m   BP Readings from Last 3 Encounters:  03/28/21 120/82  12/27/20 138/66  11/05/20 120/78    Wt Readings from Last 3 Encounters:  03/28/21 172 lb (78 kg)  12/27/20 177 lb 3.2 oz (80.4 kg)  11/05/20 173 lb 9.6 oz (78.7 kg)    Physical Exam Constitutional:      General: He is not in acute distress.    Appearance: He is well-developed.     Comments: NAD  Eyes:     Conjunctiva/sclera: Conjunctivae normal.     Pupils: Pupils are equal, round, and reactive to light.  Neck:     Thyroid: No thyromegaly.     Vascular: No JVD.  Cardiovascular:     Rate and Rhythm: Normal rate and regular rhythm.     Heart sounds: Normal heart sounds. No murmur heard.   No friction rub. No gallop.  Pulmonary:     Effort: Pulmonary effort is normal. No respiratory distress.     Breath sounds: Normal breath sounds. No wheezing or rales.  Chest:     Chest wall: No tenderness.  Abdominal:     General: Bowel sounds are normal. There is no distension.     Palpations: Abdomen is soft. There is no mass.     Tenderness: There is no abdominal tenderness. There is no guarding  or rebound.  Musculoskeletal:        General: No tenderness. Normal range of motion.     Cervical back: Normal range of motion.  Lymphadenopathy:     Cervical: No cervical adenopathy.  Skin:    General: Skin is warm and dry.     Findings: No rash.  Neurological:     Mental Status: He is alert and oriented to person, place, and time.     Cranial Nerves: No cranial nerve deficit.     Motor: No abnormal muscle tone.     Coordination: Coordination normal.     Gait: Gait normal.     Deep Tendon Reflexes: Reflexes are normal and symmetric.  Psychiatric:        Behavior: Behavior normal.        Thought Content: Thought content normal.        Judgment: Judgment normal.  She is  well-developed and muscular His facial hair growth/spread is within normal limits  Lab Results  Component Value Date   WBC 4.9 12/27/2020   HGB 15.0 12/27/2020   HCT 43.5 12/27/2020   PLT 286.0 12/27/2020   GLUCOSE 83 12/27/2020   CHOL 161 06/29/2013   TRIG 117 06/29/2013   HDL 45 06/29/2013   LDLCALC 93 06/29/2013   ALT 18 12/27/2020   AST 23 12/27/2020   NA 139 12/27/2020   K 3.8 12/27/2020   CL 103 12/27/2020   CREATININE 0.95 12/27/2020   BUN 8 12/27/2020   CO2 31 12/27/2020   TSH 4.81 (H) 12/27/2020   HGBA1C  04/05/2010    5.0 (NOTE)                                                                       According to the ADA Clinical Practice Recommendations for 2011, when HbA1c is used as a screening test:   >=6.5%   Diagnostic of Diabetes Mellitus           (if abnormal result  is confirmed)  5.7-6.4%   Increased risk of developing Diabetes Mellitus  References:Diagnosis and Classification of Diabetes Mellitus,Diabetes Care,2011,34(Suppl 1):S62-S69 and Standards of Medical Care in         Diabetes - 2011,Diabetes Care,2011,34  (Suppl 1):S11-S61.    DG Ankle Complete Right  Result Date: 06/13/2018 CLINICAL DATA:  Right ankle pain and swelling after injury. EXAM: RIGHT ANKLE - COMPLETE 3+ VIEW COMPARISON:  None. FINDINGS: There is no evidence of fracture, dislocation, or joint effusion. There is no evidence of arthropathy or other focal bone abnormality. Lateral soft tissue edema. IMPRESSION: Lateral soft tissue edema without fracture or dislocation. Electronically Signed   By: Narda Rutherford M.D.   On: 06/13/2018 06:41    Assessment & Plan:     Sonda Primes, MD

## 2021-04-01 NOTE — Assessment & Plan Note (Addendum)
He stopped using fluoxetine and BuSpar.  Continue with clonazepam as needed We discussed his worries about testosterone.  It is not an issue in my opinion.  He still would like me to check his testosterone and estrogen next time we get blood work  He is concerned about his testosterone levels and is worried about his youthful looks and lack of coarse facial hair growth. He denies drinking alcohol, using drugs We discussed his worries about testosterone.  It is not an issue in my opinion.  He still would like me to check his testosterone and estrogen next time we get blood work

## 2021-04-16 DIAGNOSIS — F4323 Adjustment disorder with mixed anxiety and depressed mood: Secondary | ICD-10-CM | POA: Diagnosis not present

## 2021-04-26 DIAGNOSIS — F4323 Adjustment disorder with mixed anxiety and depressed mood: Secondary | ICD-10-CM | POA: Diagnosis not present

## 2021-04-29 DIAGNOSIS — F32A Depression, unspecified: Secondary | ICD-10-CM | POA: Diagnosis not present

## 2021-04-29 DIAGNOSIS — F419 Anxiety disorder, unspecified: Secondary | ICD-10-CM | POA: Diagnosis not present

## 2021-04-29 DIAGNOSIS — Z20822 Contact with and (suspected) exposure to covid-19: Secondary | ICD-10-CM | POA: Diagnosis not present

## 2021-04-29 DIAGNOSIS — R45851 Suicidal ideations: Secondary | ICD-10-CM | POA: Diagnosis not present

## 2021-04-29 DIAGNOSIS — Z79899 Other long term (current) drug therapy: Secondary | ICD-10-CM | POA: Diagnosis not present

## 2021-04-30 DIAGNOSIS — Z20822 Contact with and (suspected) exposure to covid-19: Secondary | ICD-10-CM | POA: Diagnosis not present

## 2021-04-30 DIAGNOSIS — R45851 Suicidal ideations: Secondary | ICD-10-CM | POA: Diagnosis not present

## 2021-05-01 DIAGNOSIS — Z91411 Personal history of adult psychological abuse: Secondary | ICD-10-CM | POA: Diagnosis not present

## 2021-05-01 DIAGNOSIS — E039 Hypothyroidism, unspecified: Secondary | ICD-10-CM | POA: Diagnosis not present

## 2021-05-01 DIAGNOSIS — F332 Major depressive disorder, recurrent severe without psychotic features: Secondary | ICD-10-CM | POA: Diagnosis not present

## 2021-05-01 DIAGNOSIS — R45851 Suicidal ideations: Secondary | ICD-10-CM | POA: Diagnosis not present

## 2021-05-01 DIAGNOSIS — Z9114 Patient's other noncompliance with medication regimen: Secondary | ICD-10-CM | POA: Diagnosis not present

## 2021-05-01 DIAGNOSIS — Z20822 Contact with and (suspected) exposure to covid-19: Secondary | ICD-10-CM | POA: Diagnosis not present

## 2021-05-01 DIAGNOSIS — Z62811 Personal history of psychological abuse in childhood: Secondary | ICD-10-CM | POA: Diagnosis not present

## 2021-05-09 DIAGNOSIS — F4323 Adjustment disorder with mixed anxiety and depressed mood: Secondary | ICD-10-CM | POA: Diagnosis not present

## 2021-05-20 DIAGNOSIS — F331 Major depressive disorder, recurrent, moderate: Secondary | ICD-10-CM | POA: Diagnosis not present

## 2021-05-20 DIAGNOSIS — F322 Major depressive disorder, single episode, severe without psychotic features: Secondary | ICD-10-CM | POA: Diagnosis not present

## 2021-05-21 DIAGNOSIS — F331 Major depressive disorder, recurrent, moderate: Secondary | ICD-10-CM | POA: Diagnosis not present

## 2021-05-21 DIAGNOSIS — F322 Major depressive disorder, single episode, severe without psychotic features: Secondary | ICD-10-CM | POA: Diagnosis not present

## 2021-05-22 DIAGNOSIS — F331 Major depressive disorder, recurrent, moderate: Secondary | ICD-10-CM | POA: Diagnosis not present

## 2021-05-22 DIAGNOSIS — F322 Major depressive disorder, single episode, severe without psychotic features: Secondary | ICD-10-CM | POA: Diagnosis not present

## 2021-05-23 DIAGNOSIS — F322 Major depressive disorder, single episode, severe without psychotic features: Secondary | ICD-10-CM | POA: Diagnosis not present

## 2021-05-23 DIAGNOSIS — F331 Major depressive disorder, recurrent, moderate: Secondary | ICD-10-CM | POA: Diagnosis not present

## 2021-05-24 DIAGNOSIS — F331 Major depressive disorder, recurrent, moderate: Secondary | ICD-10-CM | POA: Diagnosis not present

## 2021-05-24 DIAGNOSIS — F4001 Agoraphobia with panic disorder: Secondary | ICD-10-CM | POA: Diagnosis not present

## 2021-05-24 DIAGNOSIS — F322 Major depressive disorder, single episode, severe without psychotic features: Secondary | ICD-10-CM | POA: Diagnosis not present

## 2021-05-24 DIAGNOSIS — F411 Generalized anxiety disorder: Secondary | ICD-10-CM | POA: Diagnosis not present

## 2021-05-24 DIAGNOSIS — F330 Major depressive disorder, recurrent, mild: Secondary | ICD-10-CM | POA: Diagnosis not present

## 2021-05-24 DIAGNOSIS — F4011 Social phobia, generalized: Secondary | ICD-10-CM | POA: Diagnosis not present

## 2021-05-27 DIAGNOSIS — F331 Major depressive disorder, recurrent, moderate: Secondary | ICD-10-CM | POA: Diagnosis not present

## 2021-05-27 DIAGNOSIS — F322 Major depressive disorder, single episode, severe without psychotic features: Secondary | ICD-10-CM | POA: Diagnosis not present

## 2021-05-28 DIAGNOSIS — F322 Major depressive disorder, single episode, severe without psychotic features: Secondary | ICD-10-CM | POA: Diagnosis not present

## 2021-05-28 DIAGNOSIS — F331 Major depressive disorder, recurrent, moderate: Secondary | ICD-10-CM | POA: Diagnosis not present

## 2021-05-29 DIAGNOSIS — F331 Major depressive disorder, recurrent, moderate: Secondary | ICD-10-CM | POA: Diagnosis not present

## 2021-05-29 DIAGNOSIS — F322 Major depressive disorder, single episode, severe without psychotic features: Secondary | ICD-10-CM | POA: Diagnosis not present

## 2021-05-30 DIAGNOSIS — F411 Generalized anxiety disorder: Secondary | ICD-10-CM | POA: Diagnosis not present

## 2021-05-30 DIAGNOSIS — F331 Major depressive disorder, recurrent, moderate: Secondary | ICD-10-CM | POA: Diagnosis not present

## 2021-05-30 DIAGNOSIS — F428 Other obsessive-compulsive disorder: Secondary | ICD-10-CM | POA: Diagnosis not present

## 2021-05-30 DIAGNOSIS — F319 Bipolar disorder, unspecified: Secondary | ICD-10-CM | POA: Diagnosis not present

## 2021-05-30 DIAGNOSIS — F322 Major depressive disorder, single episode, severe without psychotic features: Secondary | ICD-10-CM | POA: Diagnosis not present

## 2021-06-05 DIAGNOSIS — F428 Other obsessive-compulsive disorder: Secondary | ICD-10-CM | POA: Diagnosis not present

## 2021-06-05 DIAGNOSIS — F331 Major depressive disorder, recurrent, moderate: Secondary | ICD-10-CM | POA: Diagnosis not present

## 2021-06-05 DIAGNOSIS — F322 Major depressive disorder, single episode, severe without psychotic features: Secondary | ICD-10-CM | POA: Diagnosis not present

## 2021-06-05 DIAGNOSIS — F319 Bipolar disorder, unspecified: Secondary | ICD-10-CM | POA: Diagnosis not present

## 2021-06-05 DIAGNOSIS — F411 Generalized anxiety disorder: Secondary | ICD-10-CM | POA: Diagnosis not present

## 2021-06-06 DIAGNOSIS — F322 Major depressive disorder, single episode, severe without psychotic features: Secondary | ICD-10-CM | POA: Diagnosis not present

## 2021-06-07 DIAGNOSIS — F322 Major depressive disorder, single episode, severe without psychotic features: Secondary | ICD-10-CM | POA: Diagnosis not present

## 2021-06-07 DIAGNOSIS — F331 Major depressive disorder, recurrent, moderate: Secondary | ICD-10-CM | POA: Diagnosis not present

## 2021-06-10 DIAGNOSIS — F331 Major depressive disorder, recurrent, moderate: Secondary | ICD-10-CM | POA: Diagnosis not present

## 2021-06-10 DIAGNOSIS — F322 Major depressive disorder, single episode, severe without psychotic features: Secondary | ICD-10-CM | POA: Diagnosis not present

## 2021-06-13 DIAGNOSIS — F322 Major depressive disorder, single episode, severe without psychotic features: Secondary | ICD-10-CM | POA: Diagnosis not present

## 2021-06-14 DIAGNOSIS — F322 Major depressive disorder, single episode, severe without psychotic features: Secondary | ICD-10-CM | POA: Diagnosis not present

## 2021-06-14 DIAGNOSIS — F331 Major depressive disorder, recurrent, moderate: Secondary | ICD-10-CM | POA: Diagnosis not present

## 2021-06-26 DIAGNOSIS — F331 Major depressive disorder, recurrent, moderate: Secondary | ICD-10-CM | POA: Diagnosis not present

## 2021-07-16 DIAGNOSIS — Z79899 Other long term (current) drug therapy: Secondary | ICD-10-CM | POA: Diagnosis not present

## 2021-07-16 DIAGNOSIS — F32A Depression, unspecified: Secondary | ICD-10-CM | POA: Diagnosis not present

## 2021-07-16 DIAGNOSIS — R0789 Other chest pain: Secondary | ICD-10-CM | POA: Diagnosis not present

## 2021-07-16 DIAGNOSIS — F419 Anxiety disorder, unspecified: Secondary | ICD-10-CM | POA: Diagnosis not present

## 2021-07-16 DIAGNOSIS — R457 State of emotional shock and stress, unspecified: Secondary | ICD-10-CM | POA: Diagnosis not present

## 2021-07-18 DIAGNOSIS — F331 Major depressive disorder, recurrent, moderate: Secondary | ICD-10-CM | POA: Diagnosis not present

## 2021-07-25 DIAGNOSIS — F331 Major depressive disorder, recurrent, moderate: Secondary | ICD-10-CM | POA: Diagnosis not present

## 2021-08-15 DIAGNOSIS — F331 Major depressive disorder, recurrent, moderate: Secondary | ICD-10-CM | POA: Diagnosis not present

## 2021-09-12 DIAGNOSIS — F331 Major depressive disorder, recurrent, moderate: Secondary | ICD-10-CM | POA: Diagnosis not present

## 2021-09-25 ENCOUNTER — Other Ambulatory Visit: Payer: Self-pay | Admitting: Internal Medicine

## 2021-09-25 ENCOUNTER — Encounter: Payer: Self-pay | Admitting: Internal Medicine

## 2021-09-28 ENCOUNTER — Other Ambulatory Visit: Payer: Self-pay | Admitting: Internal Medicine

## 2021-09-28 MED ORDER — CLONAZEPAM 1 MG PO TABS: 1.0000 mg | ORAL_TABLET | Freq: Two times a day (BID) | ORAL | 0 refills | Status: DC | PRN

## 2021-09-28 MED ORDER — FLUOXETINE HCL 10 MG PO CAPS: 10.0000 mg | ORAL_CAPSULE | Freq: Every day | ORAL | 0 refills | Status: DC

## 2021-10-10 DIAGNOSIS — F331 Major depressive disorder, recurrent, moderate: Secondary | ICD-10-CM | POA: Diagnosis not present

## 2021-10-17 ENCOUNTER — Ambulatory Visit: Payer: BC Managed Care – PPO | Admitting: Internal Medicine

## 2021-10-17 DIAGNOSIS — F331 Major depressive disorder, recurrent, moderate: Secondary | ICD-10-CM | POA: Diagnosis not present

## 2021-10-24 ENCOUNTER — Other Ambulatory Visit: Payer: Self-pay | Admitting: Internal Medicine

## 2021-10-25 NOTE — Telephone Encounter (Signed)
Med not on med list pls advise on email.Marland KitchenJohny Chess

## 2021-10-28 ENCOUNTER — Other Ambulatory Visit: Payer: Self-pay | Admitting: Internal Medicine

## 2021-10-28 MED ORDER — BUSPIRONE HCL 30 MG PO TABS: 15.0000 mg | ORAL_TABLET | Freq: Two times a day (BID) | ORAL | 1 refills | Status: DC

## 2021-11-15 DIAGNOSIS — L2084 Intrinsic (allergic) eczema: Secondary | ICD-10-CM | POA: Diagnosis not present

## 2021-11-15 DIAGNOSIS — L814 Other melanin hyperpigmentation: Secondary | ICD-10-CM | POA: Diagnosis not present

## 2021-11-15 DIAGNOSIS — D225 Melanocytic nevi of trunk: Secondary | ICD-10-CM | POA: Diagnosis not present

## 2021-11-15 DIAGNOSIS — L7211 Pilar cyst: Secondary | ICD-10-CM | POA: Diagnosis not present

## 2021-11-19 ENCOUNTER — Other Ambulatory Visit: Payer: Self-pay | Admitting: Internal Medicine

## 2021-11-20 ENCOUNTER — Other Ambulatory Visit: Payer: Self-pay | Admitting: Internal Medicine

## 2021-11-21 ENCOUNTER — Ambulatory Visit: Payer: BC Managed Care – PPO | Admitting: Internal Medicine

## 2021-12-04 ENCOUNTER — Other Ambulatory Visit: Payer: Self-pay | Admitting: Internal Medicine

## 2021-12-28 ENCOUNTER — Other Ambulatory Visit: Payer: Self-pay | Admitting: Internal Medicine

## 2022-01-01 ENCOUNTER — Other Ambulatory Visit: Payer: Self-pay | Admitting: Internal Medicine

## 2022-01-11 ENCOUNTER — Other Ambulatory Visit: Payer: Self-pay | Admitting: Internal Medicine

## 2022-01-15 ENCOUNTER — Other Ambulatory Visit: Payer: Self-pay | Admitting: Internal Medicine

## 2022-02-06 ENCOUNTER — Other Ambulatory Visit: Payer: Self-pay | Admitting: Internal Medicine

## 2022-05-07 ENCOUNTER — Ambulatory Visit (INDEPENDENT_AMBULATORY_CARE_PROVIDER_SITE_OTHER): Payer: 59 | Admitting: Internal Medicine

## 2022-05-07 ENCOUNTER — Encounter: Payer: Self-pay | Admitting: Internal Medicine

## 2022-05-07 DIAGNOSIS — D72829 Elevated white blood cell count, unspecified: Secondary | ICD-10-CM | POA: Insufficient documentation

## 2022-05-07 DIAGNOSIS — H9319 Tinnitus, unspecified ear: Secondary | ICD-10-CM | POA: Insufficient documentation

## 2022-05-07 DIAGNOSIS — H9313 Tinnitus, bilateral: Secondary | ICD-10-CM

## 2022-05-07 DIAGNOSIS — D72828 Other elevated white blood cell count: Secondary | ICD-10-CM | POA: Diagnosis not present

## 2022-05-07 DIAGNOSIS — R69 Illness, unspecified: Secondary | ICD-10-CM | POA: Diagnosis not present

## 2022-05-07 DIAGNOSIS — F419 Anxiety disorder, unspecified: Secondary | ICD-10-CM | POA: Diagnosis not present

## 2022-05-07 DIAGNOSIS — D582 Other hemoglobinopathies: Secondary | ICD-10-CM | POA: Diagnosis not present

## 2022-05-07 LAB — CBC WITH DIFFERENTIAL/PLATELET
Basophils Absolute: 0 10*3/uL (ref 0.0–0.1)
Basophils Relative: 0.5 % (ref 0.0–3.0)
Eosinophils Absolute: 0.2 10*3/uL (ref 0.0–0.7)
Eosinophils Relative: 2.5 % (ref 0.0–5.0)
HCT: 45.1 % (ref 39.0–52.0)
Hemoglobin: 15.5 g/dL (ref 13.0–17.0)
Lymphocytes Relative: 42.2 % (ref 12.0–46.0)
Lymphs Abs: 2.5 10*3/uL (ref 0.7–4.0)
MCHC: 34.5 g/dL (ref 30.0–36.0)
MCV: 91.9 fl (ref 78.0–100.0)
Monocytes Absolute: 0.7 10*3/uL (ref 0.1–1.0)
Monocytes Relative: 11 % (ref 3.0–12.0)
Neutro Abs: 2.6 10*3/uL (ref 1.4–7.7)
Neutrophils Relative %: 43.8 % (ref 43.0–77.0)
Platelets: 289 10*3/uL (ref 150.0–400.0)
RBC: 4.91 Mil/uL (ref 4.22–5.81)
RDW: 12.8 % (ref 11.5–15.5)
WBC: 5.9 10*3/uL (ref 4.0–10.5)

## 2022-05-07 LAB — COMPREHENSIVE METABOLIC PANEL
ALT: 29 U/L (ref 0–53)
AST: 25 U/L (ref 0–37)
Albumin: 4.7 g/dL (ref 3.5–5.2)
Alkaline Phosphatase: 67 U/L (ref 39–117)
BUN: 13 mg/dL (ref 6–23)
CO2: 30 mEq/L (ref 19–32)
Calcium: 9.7 mg/dL (ref 8.4–10.5)
Chloride: 102 mEq/L (ref 96–112)
Creatinine, Ser: 0.98 mg/dL (ref 0.40–1.50)
GFR: 103.87 mL/min (ref >60.00)
Glucose, Bld: 96 mg/dL (ref 70–99)
Potassium: 3.6 mEq/L (ref 3.5–5.1)
Sodium: 139 mEq/L (ref 135–145)
Total Bilirubin: 1 mg/dL (ref 0.2–1.2)
Total Protein: 7.6 g/dL (ref 6.0–8.3)

## 2022-05-07 LAB — URINALYSIS
Bilirubin Urine: NEGATIVE
Hgb urine dipstick: NEGATIVE
Ketones, ur: NEGATIVE
Leukocytes,Ua: NEGATIVE
Nitrite: NEGATIVE
Specific Gravity, Urine: 1.01 (ref 1.000–1.030)
Total Protein, Urine: NEGATIVE
Urine Glucose: NEGATIVE
Urobilinogen, UA: 0.2 (ref 0.0–1.0)
pH: 7 (ref 5.0–8.0)

## 2022-05-07 LAB — TSH: TSH: 6.46 u[IU]/mL — ABNORMAL HIGH (ref 0.35–5.50)

## 2022-05-07 MED ORDER — VITAMIN D3 50 MCG (2000 UT) PO CAPS
2000.0000 [IU] | ORAL_CAPSULE | Freq: Every day | ORAL | 3 refills | Status: AC
Start: 2022-05-07 — End: ?

## 2022-05-07 MED ORDER — BUSPIRONE HCL 30 MG PO TABS: ORAL_TABLET | ORAL | 5 refills | Status: DC

## 2022-05-07 MED ORDER — FLUOXETINE HCL 10 MG PO CAPS: ORAL_CAPSULE | ORAL | 5 refills | Status: DC

## 2022-05-07 NOTE — Progress Notes (Signed)
Subjective:  Patient ID: Jay Stevenson, male    DOB: 10/02/1990  Age: 31 y.o. MRN: [id number]  CC: Annual Exam (He would like to discuss being put back on Buspar and Prozac. He has been without his prozac for a couple of weeks now)   HPI Jay Stevenson presents for anxiety C/o tinnitus  Outpatient Medications Prior to Visit  Medication Sig Dispense Refill   Zinc Sulfate (ZINC 15 PO) Take 1 tablet by mouth daily.     busPIRone (BUSPAR) 30 MG tablet TAKE 0.5 TABLETS (15 MG TOTAL) BY MOUTH IN THE MORNING AND AT BEDTIME. PLEASE SCHEDULE OFFICE VISIT FOR FURTHER REFILLS. 30 tablet 0   FLUoxetine (PROZAC) 10 MG capsule TAKE 1 CAPSULE (10 MG TOTAL) BY MOUTH DAILY. FOR DEPRESSION. SEE DR FOR REFILLS 30 capsule 0   zolpidem (AMBIEN) 10 MG tablet Take 0.5-1 tablets (5-10 mg total) by mouth at bedtime as needed for sleep. 20 tablet 1   clonazePAM (KLONOPIN) 1 MG tablet Take 1 tablet (1 mg total) by mouth 2 (two) times daily as needed for anxiety. 30 tablet 0   No facility-administered medications prior to visit.    ROS: Review of Systems  Constitutional:  Negative for appetite change, fatigue and unexpected weight change.  HENT:  Negative for congestion, nosebleeds, sneezing, sore throat and trouble swallowing.   Eyes:  Negative for itching and visual disturbance.  Respiratory:  Negative for cough.   Cardiovascular:  Negative for chest pain, palpitations and leg swelling.  Gastrointestinal:  Negative for abdominal distention, blood in stool, diarrhea and nausea.  Genitourinary:  Negative for frequency and hematuria.  Musculoskeletal:  Negative for back pain, gait problem, joint swelling and neck pain.  Skin:  Negative for rash.  Neurological:  Negative for dizziness, tremors, speech difficulty and weakness.  Psychiatric/Behavioral:  Negative for agitation, dysphoric mood, sleep disturbance and suicidal ideas. The patient is nervous/anxious.     Objective:  BP 120/80    Pulse 75   Temp 97.8 F (36.6 C) (Oral)   Ht 6\' 1"  (1.854 m)   Wt 185 lb 9.6 oz (84.2 kg)   SpO2 98%   BMI 24.49 kg/m   BP Readings from Last 3 Encounters:  05/07/22 120/80  03/28/21 120/82  12/27/20 138/66    Wt Readings from Last 3 Encounters:  05/07/22 185 lb 9.6 oz (84.2 kg)  03/28/21 172 lb (78 kg)  12/27/20 177 lb 3.2 oz (80.4 kg)    Physical Exam Constitutional:      General: He is not in acute distress.    Appearance: Normal appearance. He is well-developed.     Comments: NAD  Eyes:     Conjunctiva/sclera: Conjunctivae normal.     Pupils: Pupils are equal, round, and reactive to light.  Neck:     Thyroid: No thyromegaly.     Vascular: No JVD.  Cardiovascular:     Rate and Rhythm: Normal rate and regular rhythm.     Heart sounds: Normal heart sounds. No murmur heard.    No friction rub. No gallop.  Pulmonary:     Effort: Pulmonary effort is normal. No respiratory distress.     Breath sounds: Normal breath sounds. No wheezing or rales.  Chest:     Chest wall: No tenderness.  Abdominal:     General: Bowel sounds are normal. There is no distension.     Palpations: Abdomen is soft. There is no mass.     Tenderness: There is  no abdominal tenderness. There is no guarding or rebound.  Musculoskeletal:        General: No tenderness. Normal range of motion.     Cervical back: Normal range of motion.  Lymphadenopathy:     Cervical: No cervical adenopathy.  Skin:    General: Skin is warm and dry.     Findings: No rash.  Neurological:     Mental Status: He is alert and oriented to person, place, and time.     Cranial Nerves: No cranial nerve deficit.     Motor: No abnormal muscle tone.     Coordination: Coordination normal.     Gait: Gait normal.     Deep Tendon Reflexes: Reflexes are normal and symmetric.  Psychiatric:        Behavior: Behavior normal.        Thought Content: Thought content normal.        Judgment: Judgment normal.     Lab Results   Component Value Date   WBC 4.9 12/27/2020   HGB 15.0 12/27/2020   HCT 43.5 12/27/2020   PLT 286.0 12/27/2020   GLUCOSE 83 12/27/2020   CHOL 161 06/29/2013   TRIG 117 06/29/2013   HDL 45 06/29/2013   LDLCALC 93 06/29/2013   ALT 18 12/27/2020   AST 23 12/27/2020   NA 139 12/27/2020   K 3.8 12/27/2020   CL 103 12/27/2020   CREATININE 0.95 12/27/2020   BUN 8 12/27/2020   CO2 31 12/27/2020   TSH 4.81 (H) 12/27/2020   HGBA1C  04/05/2010    5.0 (NOTE)                                                                       According to the ADA Clinical Practice Recommendations for 2011, when HbA1c is used as a screening test:   >=6.5%   Diagnostic of Diabetes Mellitus           (if abnormal result  is confirmed)  5.7-6.4%   Increased risk of developing Diabetes Mellitus  References:Diagnosis and Classification of Diabetes Mellitus,Diabetes Care,2011,34(Suppl 1):S62-S69 and Standards of Medical Care in         Diabetes - 2011,Diabetes Care,2011,34  (Suppl 1):S11-S61.    DG Ankle Complete Right  Result Date: 06/13/2018 CLINICAL DATA:  Right ankle pain and swelling after injury. EXAM: RIGHT ANKLE - COMPLETE 3+ VIEW COMPARISON:  None. FINDINGS: There is no evidence of fracture, dislocation, or joint effusion. There is no evidence of arthropathy or other focal bone abnormality. Lateral soft tissue edema. IMPRESSION: Lateral soft tissue edema without fracture or dislocation. Electronically Signed   By: Narda Rutherford M.D.   On: 06/13/2018 06:41    Assessment & Plan:   Problem List Items Addressed This Visit     Anxiety    Cont w/ fluoxetine, clonazepam as needed - d/c, BuSpar      Relevant Medications   FLUoxetine (PROZAC) 10 MG capsule   busPIRone (BUSPAR) 30 MG tablet   Other Relevant Orders   TSH   Urinalysis   CBC with Differential/Platelet   Comprehensive metabolic panel   Elevated hemoglobin (HCC)    D/c po iron  Re-test CBC      Relevant Orders  CBC with  Differential/Platelet   Elevated WBC count    ?due to Norovirus Re-check CBC      Relevant Orders   CBC with Differential/Platelet   Tinnitus    Reduce noise exposure      Relevant Orders   TSH   Comprehensive metabolic panel      Meds ordered this encounter  Medications   FLUoxetine (PROZAC) 10 MG capsule    Sig: TAKE 1 CAPSULE (10 MG TOTAL) BY MOUTH DAILY. FOR DEPRESSION. SEE DR FOR REFILLS    Dispense:  30 capsule    Refill:  5   busPIRone (BUSPAR) 30 MG tablet    Sig: TAKE 0.5 TABLETS (15 MG TOTAL) BY MOUTH IN THE MORNING AND AT BEDTIME. PLEASE SCHEDULE OFFICE VISIT FOR FURTHER REFILLS.    Dispense:  30 tablet    Refill:  5   Cholecalciferol (VITAMIN D3) 50 MCG (2000 UT) capsule    Sig: Take 1 capsule (2,000 Units total) by mouth daily.    Dispense:  100 capsule    Refill:  3      Follow-up: Return in about 6 months (around 11/07/2022) for a follow-up visit.  Walker Kehr, MD

## 2022-05-07 NOTE — Assessment & Plan Note (Signed)
Cont w/ fluoxetine, clonazepam as needed - d/c, BuSpar

## 2022-05-07 NOTE — Assessment & Plan Note (Signed)
Reduce noise exposure

## 2022-05-07 NOTE — Assessment & Plan Note (Signed)
?  due to Norovirus Re-check CBC

## 2022-05-07 NOTE — Assessment & Plan Note (Addendum)
D/c po iron  Re-test CBC

## 2022-05-09 ENCOUNTER — Other Ambulatory Visit (INDEPENDENT_AMBULATORY_CARE_PROVIDER_SITE_OTHER): Payer: 59

## 2022-05-09 DIAGNOSIS — R7989 Other specified abnormal findings of blood chemistry: Secondary | ICD-10-CM | POA: Diagnosis not present

## 2022-05-09 LAB — T3, FREE: T3, Free: 3.7 pg/mL (ref 2.3–4.2)

## 2022-05-09 LAB — T4, FREE: Free T4: 1.04 ng/dL (ref 0.60–1.60)

## 2022-05-11 DIAGNOSIS — R339 Retention of urine, unspecified: Secondary | ICD-10-CM | POA: Diagnosis not present

## 2022-05-11 DIAGNOSIS — R1084 Generalized abdominal pain: Secondary | ICD-10-CM | POA: Diagnosis not present

## 2022-05-29 ENCOUNTER — Other Ambulatory Visit: Payer: Self-pay | Admitting: Internal Medicine

## 2022-07-11 ENCOUNTER — Telehealth: Payer: Self-pay | Admitting: Internal Medicine

## 2022-07-11 NOTE — Telephone Encounter (Signed)
MD is out of office will send to his desktop to review.. No dose change on last ov,,.lmb

## 2022-07-11 NOTE — Telephone Encounter (Signed)
Patient called and said that his prozac was supposed to be for 20mg  and not 10mg . Please advise

## 2022-07-13 MED ORDER — FLUOXETINE HCL 20 MG PO TABS: 20.0000 mg | ORAL_TABLET | Freq: Every day | ORAL | 1 refills | Status: DC

## 2022-07-13 NOTE — Telephone Encounter (Signed)
10 mg Prozac was correct as the starting dose.  Okay to increase to 20 mg if tolerated.  Thanks

## 2022-07-14 NOTE — Telephone Encounter (Signed)
Called pt no answer LMOM rx sent to [facility].../lmb 

## 2022-07-14 NOTE — Telephone Encounter (Signed)
Patient returned phone call, advised message below and that new prescription was sent to pharmacy.

## 2022-07-20 DIAGNOSIS — N4889 Other specified disorders of penis: Secondary | ICD-10-CM | POA: Diagnosis not present

## 2022-07-21 DIAGNOSIS — R69 Illness, unspecified: Secondary | ICD-10-CM | POA: Diagnosis not present

## 2022-07-21 DIAGNOSIS — F902 Attention-deficit hyperactivity disorder, combined type: Secondary | ICD-10-CM | POA: Diagnosis not present

## 2022-07-21 DIAGNOSIS — F330 Major depressive disorder, recurrent, mild: Secondary | ICD-10-CM | POA: Diagnosis not present

## 2022-07-30 DIAGNOSIS — F902 Attention-deficit hyperactivity disorder, combined type: Secondary | ICD-10-CM | POA: Diagnosis not present

## 2022-07-30 DIAGNOSIS — R69 Illness, unspecified: Secondary | ICD-10-CM | POA: Diagnosis not present

## 2022-07-30 DIAGNOSIS — F411 Generalized anxiety disorder: Secondary | ICD-10-CM | POA: Diagnosis not present

## 2022-08-06 DIAGNOSIS — F411 Generalized anxiety disorder: Secondary | ICD-10-CM | POA: Diagnosis not present

## 2022-08-06 DIAGNOSIS — R69 Illness, unspecified: Secondary | ICD-10-CM | POA: Diagnosis not present

## 2022-08-06 DIAGNOSIS — F902 Attention-deficit hyperactivity disorder, combined type: Secondary | ICD-10-CM | POA: Diagnosis not present

## 2022-08-13 DIAGNOSIS — F411 Generalized anxiety disorder: Secondary | ICD-10-CM | POA: Diagnosis not present

## 2022-08-13 DIAGNOSIS — R69 Illness, unspecified: Secondary | ICD-10-CM | POA: Diagnosis not present

## 2022-08-13 DIAGNOSIS — F902 Attention-deficit hyperactivity disorder, combined type: Secondary | ICD-10-CM | POA: Diagnosis not present

## 2022-08-20 DIAGNOSIS — F411 Generalized anxiety disorder: Secondary | ICD-10-CM | POA: Diagnosis not present

## 2022-08-20 DIAGNOSIS — F902 Attention-deficit hyperactivity disorder, combined type: Secondary | ICD-10-CM | POA: Diagnosis not present

## 2022-08-20 DIAGNOSIS — R69 Illness, unspecified: Secondary | ICD-10-CM | POA: Diagnosis not present

## 2022-08-27 DIAGNOSIS — F902 Attention-deficit hyperactivity disorder, combined type: Secondary | ICD-10-CM | POA: Diagnosis not present

## 2022-08-27 DIAGNOSIS — F411 Generalized anxiety disorder: Secondary | ICD-10-CM | POA: Diagnosis not present

## 2022-08-27 DIAGNOSIS — R69 Illness, unspecified: Secondary | ICD-10-CM | POA: Diagnosis not present

## 2022-09-10 DIAGNOSIS — F411 Generalized anxiety disorder: Secondary | ICD-10-CM | POA: Diagnosis not present

## 2022-09-10 DIAGNOSIS — F902 Attention-deficit hyperactivity disorder, combined type: Secondary | ICD-10-CM | POA: Diagnosis not present

## 2022-09-10 DIAGNOSIS — R69 Illness, unspecified: Secondary | ICD-10-CM | POA: Diagnosis not present

## 2022-09-17 DIAGNOSIS — F411 Generalized anxiety disorder: Secondary | ICD-10-CM | POA: Diagnosis not present

## 2022-09-17 DIAGNOSIS — F902 Attention-deficit hyperactivity disorder, combined type: Secondary | ICD-10-CM | POA: Diagnosis not present

## 2022-09-17 DIAGNOSIS — R69 Illness, unspecified: Secondary | ICD-10-CM | POA: Diagnosis not present

## 2022-10-01 DIAGNOSIS — F902 Attention-deficit hyperactivity disorder, combined type: Secondary | ICD-10-CM | POA: Diagnosis not present

## 2022-10-01 DIAGNOSIS — R69 Illness, unspecified: Secondary | ICD-10-CM | POA: Diagnosis not present

## 2022-10-01 DIAGNOSIS — F411 Generalized anxiety disorder: Secondary | ICD-10-CM | POA: Diagnosis not present

## 2022-10-08 DIAGNOSIS — R69 Illness, unspecified: Secondary | ICD-10-CM | POA: Diagnosis not present

## 2022-10-08 DIAGNOSIS — F902 Attention-deficit hyperactivity disorder, combined type: Secondary | ICD-10-CM | POA: Diagnosis not present

## 2022-10-08 DIAGNOSIS — F411 Generalized anxiety disorder: Secondary | ICD-10-CM | POA: Diagnosis not present

## 2022-10-15 DIAGNOSIS — R69 Illness, unspecified: Secondary | ICD-10-CM | POA: Diagnosis not present

## 2022-10-15 DIAGNOSIS — F411 Generalized anxiety disorder: Secondary | ICD-10-CM | POA: Diagnosis not present

## 2022-10-15 DIAGNOSIS — F902 Attention-deficit hyperactivity disorder, combined type: Secondary | ICD-10-CM | POA: Diagnosis not present

## 2022-10-21 ENCOUNTER — Ambulatory Visit: Payer: 59 | Admitting: Internal Medicine

## 2022-10-22 DIAGNOSIS — R69 Illness, unspecified: Secondary | ICD-10-CM | POA: Diagnosis not present

## 2022-10-22 DIAGNOSIS — F902 Attention-deficit hyperactivity disorder, combined type: Secondary | ICD-10-CM | POA: Diagnosis not present

## 2022-10-22 DIAGNOSIS — F411 Generalized anxiety disorder: Secondary | ICD-10-CM | POA: Diagnosis not present

## 2022-10-29 ENCOUNTER — Encounter: Payer: Self-pay | Admitting: Internal Medicine

## 2022-10-29 ENCOUNTER — Ambulatory Visit (INDEPENDENT_AMBULATORY_CARE_PROVIDER_SITE_OTHER): Payer: 59 | Admitting: Internal Medicine

## 2022-10-29 VITALS — BP 122/70 | HR 70 | Temp 98.4°F | Ht 73.0 in | Wt 180.0 lb

## 2022-10-29 DIAGNOSIS — F1911 Other psychoactive substance abuse, in remission: Secondary | ICD-10-CM

## 2022-10-29 DIAGNOSIS — F39 Unspecified mood [affective] disorder: Secondary | ICD-10-CM

## 2022-10-29 DIAGNOSIS — G4700 Insomnia, unspecified: Secondary | ICD-10-CM | POA: Insufficient documentation

## 2022-10-29 DIAGNOSIS — R69 Illness, unspecified: Secondary | ICD-10-CM | POA: Diagnosis not present

## 2022-10-29 DIAGNOSIS — F5102 Adjustment insomnia: Secondary | ICD-10-CM

## 2022-10-29 MED ORDER — TRAZODONE HCL 50 MG PO TABS: 25.0000 mg | ORAL_TABLET | Freq: Every evening | ORAL | 3 refills | Status: DC | PRN

## 2022-10-29 MED ORDER — TRIAMCINOLONE ACETONIDE 0.1 % EX CREA: 1.0000 | TOPICAL_CREAM | Freq: Two times a day (BID) | CUTANEOUS | 3 refills | Status: AC | PRN

## 2022-10-29 MED ORDER — ZOLPIDEM TARTRATE 10 MG PO TABS: 5.0000 mg | ORAL_TABLET | Freq: Every evening | ORAL | 3 refills | Status: DC | PRN

## 2022-10-29 NOTE — Assessment & Plan Note (Addendum)
Trazodone at hs or Zolpidem 5-10 mg prn at 5 am  Potential benefits of a long term benzodiazepines  use as well as potential risks  and complications were explained to the patient and were aknowledged.

## 2022-10-29 NOTE — Progress Notes (Signed)
Subjective:  Patient ID: Jay Stevenson, male    DOB: 03-13-1991  Age: 32 y.o. 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 PLEASE SCHEDULE OFFICE VISIT FOR FURTHER REFILLS. 30 tablet 5   Cholecalciferol (VITAMIN D3) 50 MCG (2000 UT) capsule Take 1 capsule (2,000 Units total) by mouth daily. 100 capsule 3   FLUoxetine (PROZAC) 20 MG tablet Take 1 tablet (20 mg total) by mouth daily. 90 tablet 1   Zinc Sulfate (ZINC 15 PO) Take 1 tablet by mouth daily.     zolpidem (AMBIEN) 10 MG tablet Take 0.5-1 tablets (5-10 mg total) by mouth at bedtime as needed for sleep. 20 tablet 1   No facility-administered medications prior to visit.    ROS: Review of Systems  Constitutional:  Negative for appetite change, fatigue and unexpected weight change.  HENT:  Negative for congestion, nosebleeds, sneezing, sore throat and trouble swallowing.   Eyes:  Negative for itching and visual disturbance.  Respiratory:  Negative for cough.   Cardiovascular:  Negative for chest pain, palpitations and leg swelling.  Gastrointestinal:  Negative for abdominal distention, blood in stool, diarrhea and nausea.  Genitourinary:  Negative for frequency and hematuria.  Musculoskeletal:  Negative for back pain, gait problem, joint swelling and neck pain.  Skin:  Negative for rash.  Neurological:  Negative for dizziness, tremors, speech difficulty and weakness.  Psychiatric/Behavioral:  Negative for agitation, dysphoric mood and sleep disturbance. The patient is not  nervous/anxious.     Objective:  BP 122/70 (BP Location: Right Arm, Patient Position: Sitting, Cuff Size: Normal)   Pulse 70   Temp 98.4 F (36.9 C) (Oral)   Ht 6' 1"$  (1.854 m)   Wt 180 lb (81.6 kg)   SpO2 99%   BMI 23.75 kg/m   BP Readings from Last 3 Encounters:  10/29/22 122/70  05/07/22 120/80  03/28/21 120/82    Wt Readings from Last 3 Encounters:  10/29/22 180 lb (81.6 kg)  05/07/22 185 lb 9.6 oz (84.2 kg)  03/28/21 172 lb (78 kg)    Physical Exam Constitutional:      General: He is not in acute distress.    Appearance: Normal appearance. He is well-developed.     Comments: NAD  Eyes:     Conjunctiva/sclera: Conjunctivae normal.     Pupils: Pupils are equal, round, and reactive to light.  Neck:     Thyroid: No thyromegaly.     Vascular: No JVD.  Cardiovascular:     Rate and Rhythm: Normal rate and regular rhythm.     Heart sounds: Normal heart sounds. No murmur heard.    No friction rub. No gallop.  Pulmonary:     Effort: Pulmonary effort is normal. No respiratory distress.     Breath sounds: Normal breath sounds. No wheezing or rales.  Chest:     Chest wall: No tenderness.  Abdominal:     General: Bowel sounds are normal. There is no distension.  Palpations: Abdomen is soft. There is no mass.     Tenderness: There is no abdominal tenderness. There is no guarding or rebound.  Musculoskeletal:        General: No tenderness. Normal range of motion.     Cervical back: Normal range of motion.  Lymphadenopathy:     Cervical: No cervical adenopathy.  Skin:    General: Skin is warm and dry.     Findings: No rash.  Neurological:     Mental Status: He is alert and oriented to person, place, and time.     Cranial Nerves: No cranial nerve deficit.     Motor: No abnormal muscle tone.     Coordination: Coordination normal.     Gait: Gait normal.     Deep Tendon Reflexes: Reflexes are normal and symmetric.  Psychiatric:        Behavior: Behavior normal.         Thought Content: Thought content normal.        Judgment: Judgment normal.     Lab Results  Component Value Date   WBC 5.9 05/07/2022   HGB 15.5 05/07/2022   HCT 45.1 05/07/2022   PLT 289.0 05/07/2022   GLUCOSE 96 05/07/2022   CHOL 161 06/29/2013   TRIG 117 06/29/2013   HDL 45 06/29/2013   LDLCALC 93 06/29/2013   ALT 29 05/07/2022   AST 25 05/07/2022   NA 139 05/07/2022   K 3.6 05/07/2022   CL 102 05/07/2022   CREATININE 0.98 05/07/2022   BUN 13 05/07/2022   CO2 30 05/07/2022   TSH 6.46 (H) 05/07/2022   HGBA1C  04/05/2010    5.0 (NOTE)                                                                       According to the ADA Clinical Practice Recommendations for 2011, when HbA1c is used as a screening test:   >=6.5%   Diagnostic of Diabetes Mellitus           (if abnormal result  is confirmed)  5.7-6.4%   Increased risk of developing Diabetes Mellitus  References:Diagnosis and Classification of Diabetes Mellitus,Diabetes [id number] 1):S62-S69 and Standards of Medical Care in         Diabetes - 2011,Diabetes Care,2011,34  (Suppl 1):S11-S61.    DG Ankle Complete Right  Result Date: 06/13/2018 CLINICAL DATA:  Right ankle pain and swelling after injury. EXAM: RIGHT ANKLE - COMPLETE 3+ VIEW COMPARISON:  None. FINDINGS: There is no evidence of fracture, dislocation, or joint effusion. There is no evidence of arthropathy or other focal bone abnormality. Lateral soft tissue edema. IMPRESSION: Lateral soft tissue edema without fracture or dislocation. Electronically Signed   By: Keith Rake M.D.   On: 06/13/2018 06:41    Assessment & Plan:   Problem List Items Addressed This Visit       Other   Insomnia disorder - Primary    Trazodone at hs or Zolpidem 5-10 mg prn at 5 am  Potential benefits of a long term benzodiazepines  use as well as potential risks  and complications were explained to the patient and were aknowledged.       History of substance abuse  ([place])  Jay Stevenson seems to be doing well.         Meds ordered this encounter  Medications   DISCONTD: zolpidem (AMBIEN) 10 MG tablet    Sig: Take 0.5-1 tablets (5-10 mg total) by mouth at bedtime as needed for sleep.    Dispense:  30 tablet    Refill:  3   traZODone (DESYREL) 50 MG tablet    Sig: Take 0.5-1 tablets (25-50 mg total) by mouth at bedtime as needed for sleep.    Dispense:  30 tablet    Refill:  3   triamcinolone cream (KENALOG) 0.1 %    Sig: Apply 1 Application topically 2 (two) times daily as needed.    Dispense:  160 g    Refill:  3      Follow-up: Return in about 3 months (around 01/27/2023) for a follow-up visit.  Walker Kehr, MD

## 2022-10-31 ENCOUNTER — Telehealth: Payer: Self-pay | Admitting: *Deleted

## 2022-10-31 NOTE — Telephone Encounter (Signed)
Insurance will not cover Zolpidem 10 mg.. the alternatives are Zaleplon 10 mg and Eszopiclone 3 mg.Marland KitchenJohny Chess

## 2022-11-03 MED ORDER — ESZOPICLONE 1 MG PO TABS: 1.0000 mg | ORAL_TABLET | Freq: Every evening | ORAL | 3 refills | Status: DC | PRN

## 2022-11-03 NOTE — Telephone Encounter (Signed)
OK Eszopiclone 1 mg.Marland KitchenJohny Chess  Thx

## 2022-11-09 ENCOUNTER — Encounter: Payer: Self-pay | Admitting: Internal Medicine

## 2022-11-09 NOTE — Assessment & Plan Note (Signed)
Jay Stevenson seems to be doing well.

## 2022-11-09 NOTE — Assessment & Plan Note (Signed)
Continue on BuSpar, fluoxetine, trazodone Treat insomnia

## 2022-11-12 DIAGNOSIS — F902 Attention-deficit hyperactivity disorder, combined type: Secondary | ICD-10-CM | POA: Diagnosis not present

## 2022-11-12 DIAGNOSIS — F411 Generalized anxiety disorder: Secondary | ICD-10-CM | POA: Diagnosis not present

## 2022-11-12 DIAGNOSIS — R69 Illness, unspecified: Secondary | ICD-10-CM | POA: Diagnosis not present

## 2022-11-21 ENCOUNTER — Other Ambulatory Visit: Payer: Self-pay | Admitting: Internal Medicine

## 2022-12-16 DIAGNOSIS — H1013 Acute atopic conjunctivitis, bilateral: Secondary | ICD-10-CM | POA: Diagnosis not present

## 2022-12-16 DIAGNOSIS — J3089 Other allergic rhinitis: Secondary | ICD-10-CM | POA: Diagnosis not present

## 2022-12-16 DIAGNOSIS — J3081 Allergic rhinitis due to animal (cat) (dog) hair and dander: Secondary | ICD-10-CM | POA: Diagnosis not present

## 2022-12-16 DIAGNOSIS — J301 Allergic rhinitis due to pollen: Secondary | ICD-10-CM | POA: Diagnosis not present

## 2022-12-23 DIAGNOSIS — F5101 Primary insomnia: Secondary | ICD-10-CM | POA: Diagnosis not present

## 2022-12-23 DIAGNOSIS — F339 Major depressive disorder, recurrent, unspecified: Secondary | ICD-10-CM | POA: Diagnosis not present

## 2022-12-23 DIAGNOSIS — F411 Generalized anxiety disorder: Secondary | ICD-10-CM | POA: Diagnosis not present

## 2022-12-31 DIAGNOSIS — F411 Generalized anxiety disorder: Secondary | ICD-10-CM | POA: Diagnosis not present

## 2022-12-31 DIAGNOSIS — F5101 Primary insomnia: Secondary | ICD-10-CM | POA: Diagnosis not present

## 2022-12-31 DIAGNOSIS — F339 Major depressive disorder, recurrent, unspecified: Secondary | ICD-10-CM | POA: Diagnosis not present

## 2023-01-11 DIAGNOSIS — R45851 Suicidal ideations: Secondary | ICD-10-CM | POA: Diagnosis not present

## 2023-01-11 DIAGNOSIS — R457 State of emotional shock and stress, unspecified: Secondary | ICD-10-CM | POA: Diagnosis not present

## 2023-01-12 DIAGNOSIS — Z87891 Personal history of nicotine dependence: Secondary | ICD-10-CM | POA: Diagnosis not present

## 2023-01-12 DIAGNOSIS — Z9151 Personal history of suicidal behavior: Secondary | ICD-10-CM | POA: Diagnosis not present

## 2023-01-12 DIAGNOSIS — F332 Major depressive disorder, recurrent severe without psychotic features: Secondary | ICD-10-CM | POA: Diagnosis not present

## 2023-01-12 DIAGNOSIS — Z79899 Other long term (current) drug therapy: Secondary | ICD-10-CM | POA: Diagnosis not present

## 2023-01-12 DIAGNOSIS — F419 Anxiety disorder, unspecified: Secondary | ICD-10-CM | POA: Diagnosis not present

## 2023-01-12 DIAGNOSIS — Z1152 Encounter for screening for COVID-19: Secondary | ICD-10-CM | POA: Diagnosis not present

## 2023-01-12 DIAGNOSIS — Z91148 Patient's other noncompliance with medication regimen for other reason: Secondary | ICD-10-CM | POA: Diagnosis not present

## 2023-01-12 DIAGNOSIS — R45851 Suicidal ideations: Secondary | ICD-10-CM | POA: Diagnosis not present

## 2023-01-13 DIAGNOSIS — R45851 Suicidal ideations: Secondary | ICD-10-CM | POA: Diagnosis not present

## 2023-01-13 DIAGNOSIS — F1290 Cannabis use, unspecified, uncomplicated: Secondary | ICD-10-CM | POA: Diagnosis not present

## 2023-01-13 DIAGNOSIS — F322 Major depressive disorder, single episode, severe without psychotic features: Secondary | ICD-10-CM | POA: Diagnosis not present

## 2023-01-13 DIAGNOSIS — Z91148 Patient's other noncompliance with medication regimen for other reason: Secondary | ICD-10-CM | POA: Diagnosis not present

## 2023-01-13 DIAGNOSIS — Z1152 Encounter for screening for COVID-19: Secondary | ICD-10-CM | POA: Diagnosis not present

## 2023-01-13 DIAGNOSIS — Z87891 Personal history of nicotine dependence: Secondary | ICD-10-CM | POA: Diagnosis not present

## 2023-01-13 DIAGNOSIS — F332 Major depressive disorder, recurrent severe without psychotic features: Secondary | ICD-10-CM | POA: Diagnosis not present

## 2023-01-13 DIAGNOSIS — Z818 Family history of other mental and behavioral disorders: Secondary | ICD-10-CM | POA: Diagnosis not present

## 2023-01-13 DIAGNOSIS — Z79899 Other long term (current) drug therapy: Secondary | ICD-10-CM | POA: Diagnosis not present

## 2023-01-13 DIAGNOSIS — Z111 Encounter for screening for respiratory tuberculosis: Secondary | ICD-10-CM | POA: Diagnosis not present

## 2023-01-13 DIAGNOSIS — Z9151 Personal history of suicidal behavior: Secondary | ICD-10-CM | POA: Diagnosis not present

## 2023-01-13 DIAGNOSIS — G4700 Insomnia, unspecified: Secondary | ICD-10-CM | POA: Diagnosis not present

## 2023-01-13 DIAGNOSIS — K5900 Constipation, unspecified: Secondary | ICD-10-CM | POA: Diagnosis not present

## 2023-01-13 DIAGNOSIS — F329 Major depressive disorder, single episode, unspecified: Secondary | ICD-10-CM | POA: Diagnosis not present

## 2023-01-13 DIAGNOSIS — F419 Anxiety disorder, unspecified: Secondary | ICD-10-CM | POA: Diagnosis not present

## 2023-01-13 DIAGNOSIS — Z813 Family history of other psychoactive substance abuse and dependence: Secondary | ICD-10-CM | POA: Diagnosis not present

## 2023-01-19 DIAGNOSIS — Z818 Family history of other mental and behavioral disorders: Secondary | ICD-10-CM | POA: Diagnosis not present

## 2023-01-19 DIAGNOSIS — F322 Major depressive disorder, single episode, severe without psychotic features: Secondary | ICD-10-CM | POA: Diagnosis not present

## 2023-01-19 DIAGNOSIS — Z111 Encounter for screening for respiratory tuberculosis: Secondary | ICD-10-CM | POA: Diagnosis not present

## 2023-01-19 DIAGNOSIS — F329 Major depressive disorder, single episode, unspecified: Secondary | ICD-10-CM | POA: Diagnosis not present

## 2023-01-19 DIAGNOSIS — F1290 Cannabis use, unspecified, uncomplicated: Secondary | ICD-10-CM | POA: Diagnosis not present

## 2023-01-19 DIAGNOSIS — R45851 Suicidal ideations: Secondary | ICD-10-CM | POA: Diagnosis not present

## 2023-01-19 DIAGNOSIS — K5900 Constipation, unspecified: Secondary | ICD-10-CM | POA: Diagnosis not present

## 2023-01-19 DIAGNOSIS — G4700 Insomnia, unspecified: Secondary | ICD-10-CM | POA: Diagnosis not present

## 2023-01-19 DIAGNOSIS — Z813 Family history of other psychoactive substance abuse and dependence: Secondary | ICD-10-CM | POA: Diagnosis not present

## 2023-01-19 DIAGNOSIS — F419 Anxiety disorder, unspecified: Secondary | ICD-10-CM | POA: Diagnosis not present

## 2023-01-26 DIAGNOSIS — F339 Major depressive disorder, recurrent, unspecified: Secondary | ICD-10-CM | POA: Diagnosis not present

## 2023-01-26 DIAGNOSIS — F411 Generalized anxiety disorder: Secondary | ICD-10-CM | POA: Diagnosis not present

## 2023-01-26 DIAGNOSIS — F5101 Primary insomnia: Secondary | ICD-10-CM | POA: Diagnosis not present

## 2023-02-02 DIAGNOSIS — F331 Major depressive disorder, recurrent, moderate: Secondary | ICD-10-CM | POA: Diagnosis not present

## 2023-02-04 DIAGNOSIS — F331 Major depressive disorder, recurrent, moderate: Secondary | ICD-10-CM | POA: Diagnosis not present

## 2023-02-06 DIAGNOSIS — F331 Major depressive disorder, recurrent, moderate: Secondary | ICD-10-CM | POA: Diagnosis not present

## 2023-02-09 DIAGNOSIS — F331 Major depressive disorder, recurrent, moderate: Secondary | ICD-10-CM | POA: Diagnosis not present

## 2023-02-11 DIAGNOSIS — F603 Borderline personality disorder: Secondary | ICD-10-CM | POA: Diagnosis not present

## 2023-02-12 ENCOUNTER — Ambulatory Visit: Payer: 59 | Admitting: Internal Medicine

## 2023-02-13 DIAGNOSIS — F331 Major depressive disorder, recurrent, moderate: Secondary | ICD-10-CM | POA: Diagnosis not present

## 2023-02-16 DIAGNOSIS — F331 Major depressive disorder, recurrent, moderate: Secondary | ICD-10-CM | POA: Diagnosis not present

## 2023-02-18 DIAGNOSIS — F331 Major depressive disorder, recurrent, moderate: Secondary | ICD-10-CM | POA: Diagnosis not present

## 2023-02-19 DIAGNOSIS — F331 Major depressive disorder, recurrent, moderate: Secondary | ICD-10-CM | POA: Diagnosis not present

## 2023-02-20 DIAGNOSIS — F331 Major depressive disorder, recurrent, moderate: Secondary | ICD-10-CM | POA: Diagnosis not present

## 2023-02-24 DIAGNOSIS — F331 Major depressive disorder, recurrent, moderate: Secondary | ICD-10-CM | POA: Diagnosis not present

## 2023-02-26 DIAGNOSIS — F331 Major depressive disorder, recurrent, moderate: Secondary | ICD-10-CM | POA: Diagnosis not present

## 2023-03-02 DIAGNOSIS — F331 Major depressive disorder, recurrent, moderate: Secondary | ICD-10-CM | POA: Diagnosis not present

## 2023-03-05 ENCOUNTER — Ambulatory Visit: Payer: 59 | Admitting: Internal Medicine

## 2023-03-05 DIAGNOSIS — F331 Major depressive disorder, recurrent, moderate: Secondary | ICD-10-CM | POA: Diagnosis not present

## 2023-03-06 DIAGNOSIS — F331 Major depressive disorder, recurrent, moderate: Secondary | ICD-10-CM | POA: Diagnosis not present

## 2023-03-09 DIAGNOSIS — F331 Major depressive disorder, recurrent, moderate: Secondary | ICD-10-CM | POA: Diagnosis not present

## 2023-03-10 DIAGNOSIS — F5101 Primary insomnia: Secondary | ICD-10-CM | POA: Diagnosis not present

## 2023-03-10 DIAGNOSIS — L7211 Pilar cyst: Secondary | ICD-10-CM | POA: Diagnosis not present

## 2023-03-10 DIAGNOSIS — L720 Epidermal cyst: Secondary | ICD-10-CM | POA: Diagnosis not present

## 2023-03-10 DIAGNOSIS — B081 Molluscum contagiosum: Secondary | ICD-10-CM | POA: Diagnosis not present

## 2023-03-10 DIAGNOSIS — Z7189 Other specified counseling: Secondary | ICD-10-CM | POA: Diagnosis not present

## 2023-03-10 DIAGNOSIS — D1801 Hemangioma of skin and subcutaneous tissue: Secondary | ICD-10-CM | POA: Diagnosis not present

## 2023-03-10 DIAGNOSIS — F411 Generalized anxiety disorder: Secondary | ICD-10-CM | POA: Diagnosis not present

## 2023-03-10 DIAGNOSIS — F603 Borderline personality disorder: Secondary | ICD-10-CM | POA: Diagnosis not present

## 2023-03-10 DIAGNOSIS — F339 Major depressive disorder, recurrent, unspecified: Secondary | ICD-10-CM | POA: Diagnosis not present

## 2023-03-11 DIAGNOSIS — F331 Major depressive disorder, recurrent, moderate: Secondary | ICD-10-CM | POA: Diagnosis not present

## 2023-03-12 DIAGNOSIS — F331 Major depressive disorder, recurrent, moderate: Secondary | ICD-10-CM | POA: Diagnosis not present

## 2023-03-16 DIAGNOSIS — F331 Major depressive disorder, recurrent, moderate: Secondary | ICD-10-CM | POA: Diagnosis not present

## 2023-03-18 DIAGNOSIS — F331 Major depressive disorder, recurrent, moderate: Secondary | ICD-10-CM | POA: Diagnosis not present

## 2023-03-20 DIAGNOSIS — F331 Major depressive disorder, recurrent, moderate: Secondary | ICD-10-CM | POA: Diagnosis not present

## 2023-03-23 DIAGNOSIS — F331 Major depressive disorder, recurrent, moderate: Secondary | ICD-10-CM | POA: Diagnosis not present

## 2023-03-24 DIAGNOSIS — F331 Major depressive disorder, recurrent, moderate: Secondary | ICD-10-CM | POA: Diagnosis not present

## 2023-03-25 DIAGNOSIS — F331 Major depressive disorder, recurrent, moderate: Secondary | ICD-10-CM | POA: Diagnosis not present

## 2023-03-31 DIAGNOSIS — F331 Major depressive disorder, recurrent, moderate: Secondary | ICD-10-CM | POA: Diagnosis not present

## 2023-04-07 DIAGNOSIS — F331 Major depressive disorder, recurrent, moderate: Secondary | ICD-10-CM | POA: Diagnosis not present

## 2023-04-16 DIAGNOSIS — F331 Major depressive disorder, recurrent, moderate: Secondary | ICD-10-CM | POA: Diagnosis not present

## 2023-05-12 DIAGNOSIS — F331 Major depressive disorder, recurrent, moderate: Secondary | ICD-10-CM | POA: Diagnosis not present

## 2023-06-02 ENCOUNTER — Ambulatory Visit: Payer: 59 | Admitting: Internal Medicine

## 2023-06-17 ENCOUNTER — Ambulatory Visit (INDEPENDENT_AMBULATORY_CARE_PROVIDER_SITE_OTHER): Payer: 59

## 2023-06-17 ENCOUNTER — Ambulatory Visit (INDEPENDENT_AMBULATORY_CARE_PROVIDER_SITE_OTHER): Payer: 59 | Admitting: Internal Medicine

## 2023-06-17 ENCOUNTER — Encounter: Payer: Self-pay | Admitting: Internal Medicine

## 2023-06-17 VITALS — BP 110/70 | HR 73 | Temp 98.6°F | Ht 73.0 in | Wt 178.0 lb

## 2023-06-17 DIAGNOSIS — M79672 Pain in left foot: Secondary | ICD-10-CM

## 2023-06-17 MED ORDER — NAPROXEN 500 MG PO TABS: 500.0000 mg | ORAL_TABLET | Freq: Two times a day (BID) | ORAL | 2 refills | Status: DC | PRN

## 2023-06-17 NOTE — Progress Notes (Signed)
Subjective:  Patient ID: Jay Stevenson, male    DOB: 30-May-1991  Age: 32 y.o. MRN: [id number]  CC: No chief complaint on file.   HPI Boomer Dodaro Chiong Stevenson presents for L foot pain of several weeks duration primarily located in the first MTP joint  Outpatient Medications Prior to Visit  Medication Sig Dispense Refill   busPIRone (BUSPAR) 30 MG tablet TAKE 0.5 TABLETS (15 MG TOTAL) BY MOUTH IN THE MORNING AND AT BEDTIME. PLEASE SCHEDULE OFFICE VISIT FOR FURTHER REFILLS. 30 tablet 5   Cholecalciferol (VITAMIN D3) 50 MCG (2000 UT) capsule Take 1 capsule (2,000 Units total) by mouth daily. 100 capsule 3   eszopiclone (LUNESTA) 1 MG TABS tablet Take 1 tablet (1 mg total) by mouth at bedtime as needed for sleep. Take immediately before bedtime 30 tablet 3   FLUoxetine (PROZAC) 20 MG tablet Take 1 tablet (20 mg total) by mouth daily. 90 tablet 1   traZODone (DESYREL) 50 MG tablet Take 0.5-1 tablets (25-50 mg total) by mouth at bedtime as needed for sleep. 30 tablet 3   triamcinolone cream (KENALOG) 0.1 % Apply 1 Application topically 2 (two) times daily as needed. 160 g 3   Zinc Sulfate (ZINC 15 PO) Take 1 tablet by mouth daily.     No facility-administered medications prior to visit.    ROS: Review of Systems  Constitutional:  Negative for appetite change, fatigue and unexpected weight change.  HENT:  Negative for congestion, nosebleeds, sneezing, sore throat and trouble swallowing.   Eyes:  Negative for itching and visual disturbance.  Respiratory:  Negative for cough.   Cardiovascular:  Negative for chest pain, palpitations and leg swelling.  Gastrointestinal:  Negative for abdominal distention, blood in stool, diarrhea and nausea.  Genitourinary:  Negative for frequency and hematuria.  Musculoskeletal:  Negative for arthralgias, back pain, gait problem, joint swelling and neck pain.  Skin:  Negative for rash.  Neurological:  Negative for dizziness, tremors, speech  difficulty and weakness.  Psychiatric/Behavioral:  Negative for agitation, dysphoric mood and sleep disturbance. The patient is not nervous/anxious.     Objective:  BP 110/70 (BP Location: Right Arm, Patient Position: Sitting, Cuff Size: Large)   Pulse 73   Temp 98.6 F (37 C) (Oral)   Ht 6\' 1"  (1.854 m)   Wt 178 lb (80.7 kg)   SpO2 97%   BMI 23.48 kg/m   BP Readings from Last 3 Encounters:  06/17/23 110/70  10/29/22 122/70  05/07/22 120/80    Wt Readings from Last 3 Encounters:  06/17/23 178 lb (80.7 kg)  10/29/22 180 lb (81.6 kg)  05/07/22 185 lb 9.6 oz (84.2 kg)    Physical Exam Constitutional:      General: He is not in acute distress.    Appearance: Normal appearance. He is well-developed.     Comments: NAD  Eyes:     Conjunctiva/sclera: Conjunctivae normal.     Pupils: Pupils are equal, round, and reactive to light.  Neck:     Thyroid: No thyromegaly.     Vascular: No JVD.  Cardiovascular:     Rate and Rhythm: Normal rate and regular rhythm.     Heart sounds: Normal heart sounds. No murmur heard.    No friction rub. No gallop.  Pulmonary:     Effort: Pulmonary effort is normal. No respiratory distress.     Breath sounds: Normal breath sounds. No wheezing or rales.  Chest:     Chest wall: No  tenderness.  Abdominal:     General: Bowel sounds are normal. There is no distension.     Palpations: Abdomen is soft. There is no mass.     Tenderness: There is no abdominal tenderness. There is no guarding or rebound.  Musculoskeletal:        General: Tenderness present. Normal range of motion.     Cervical back: Normal range of motion.  Lymphadenopathy:     Cervical: No cervical adenopathy.  Skin:    General: Skin is warm and dry.     Findings: No rash.  Neurological:     Mental Status: He is alert and oriented to person, place, and time.     Cranial Nerves: No cranial nerve deficit.     Motor: No abnormal muscle tone.     Coordination: Coordination normal.      Gait: Gait normal.     Deep Tendon Reflexes: Reflexes are normal and symmetric.  Psychiatric:        Behavior: Behavior normal.        Thought Content: Thought content normal.        Judgment: Judgment normal.   Right first MTP is painful at the bottom, a tiny bit decreased range of motion.  No swelling or discoloration  Lab Results  Component Value Date   WBC 5.9 05/07/2022   HGB 15.5 05/07/2022   HCT 45.1 05/07/2022   PLT 289.0 05/07/2022   GLUCOSE 96 05/07/2022   CHOL 161 06/29/2013   TRIG 117 06/29/2013   HDL 45 06/29/2013   LDLCALC 93 06/29/2013   ALT 29 05/07/2022   AST 25 05/07/2022   NA 139 05/07/2022   K 3.6 05/07/2022   CL 102 05/07/2022   CREATININE 0.98 05/07/2022   BUN 13 05/07/2022   CO2 30 05/07/2022   TSH 6.46 (H) 05/07/2022   HGBA1C  04/05/2010    5.0 (NOTE)                                                                       According to the ADA Clinical Practice Recommendations for 2011, when HbA1c is used as a screening test:   >=6.5%   Diagnostic of Diabetes Mellitus           (if abnormal result  is confirmed)  5.7-6.4%   Increased risk of developing Diabetes Mellitus  References:Diagnosis and Classification of Diabetes Mellitus,Diabetes Care,2011,34(Suppl 1):S62-S69 and Standards of Medical Care in         Diabetes - 2011,Diabetes Care,2011,34  (Suppl 1):S11-S61.    DG Ankle Complete Right  Result Date: 06/13/2018 CLINICAL DATA:  Right ankle pain and swelling after injury. EXAM: RIGHT ANKLE - COMPLETE 3+ VIEW COMPARISON:  None. FINDINGS: There is no evidence of fracture, dislocation, or joint effusion. There is no evidence of arthropathy or other focal bone abnormality. Lateral soft tissue edema. IMPRESSION: Lateral soft tissue edema without fracture or dislocation. Electronically Signed   By: Narda Rutherford M.D.   On: 06/13/2018 06:41    Assessment & Plan:   Problem List Items Addressed This Visit   None     No orders of the defined  types were placed in this encounter.     Follow-up: No follow-ups on file.  Sonda Primes, MD

## 2023-06-17 NOTE — Patient Instructions (Signed)
Blue-Emu cream  use 2-3 times a day Solid sole shoe - Hoka

## 2023-06-17 NOTE — Assessment & Plan Note (Addendum)
1st MTP pain -probable capsulitis of the first MTP joint Styles will get new shoes with more solid sole and a good arch support Blue-Emu cream was recommended to use 2-3 times a day Podiatry referral if problems. Will obtain x-ray

## 2023-06-23 ENCOUNTER — Encounter: Payer: Self-pay | Admitting: Internal Medicine

## 2023-07-13 ENCOUNTER — Encounter: Payer: Self-pay | Admitting: Internal Medicine

## 2023-07-22 ENCOUNTER — Other Ambulatory Visit: Payer: Self-pay | Admitting: Internal Medicine

## 2023-07-22 MED ORDER — BUSPIRONE HCL 30 MG PO TABS: 30.0000 mg | ORAL_TABLET | Freq: Two times a day (BID) | ORAL | 5 refills | Status: DC

## 2023-08-06 NOTE — Progress Notes (Signed)
[facility] [address] [place] [address] Phone: [phone] Subjective:   INadine Counts, am serving as a scribe for Dr. Antoine Primas.  I'm seeing this patient by the request  of:  Plotnikov, Georgina Quint, MD  CC: left foot pain   ION:[id number]  Jay Stevenson is a 32 y.o. male coming in with complaint of L shoulder and L foot pain. Patient states L shoulder has been tight for about 2 months. Only when doing overhead activity. L foot has pressure on the top of great toe. Only hurts  in extension.      Past Medical History:  Diagnosis Date   Acute urinary retention 04/14/2012   6/13 due to oxycodone; he had to be cathed    Alcohol abuse    Ataxia 06/16/2011   Induced by prior drug use, I guess    Depression    H/O: substance abuse (HCC)    IBS (irritable bowel syndrome)    Irritable bowel syndrome 06/27/2013   Mood disorder (HCC)    anger management   Panic attack    Polysubstance abuse (HCC)    Seasonal allergies    Thrombosed hemorrhoids 04/14/2012   Summer 2013; seems to be related to constipation; painful - s/p incision    Past Surgical History:  Procedure Laterality Date   WISDOM TOOTH EXTRACTION     Social History   Socioeconomic History   Marital status: Single    Spouse name: n/a   Number of children: 0   Years of education: Not on file   Highest education level: Not on file  Occupational History   Occupation: unemployed    Comment: previous Conservation officer, nature, floor work  Tobacco Use   Smoking status: Former    Current packs/day: 0.00    Types: Cigarettes    Start date: 01/13/2011    Quit date: 01/13/2012    Years since quitting: 11.5   Smokeless tobacco: Never   Tobacco comments:    2-3 ciggs a week  Substance and Sexual Activity   Alcohol use: Yes    Comment: minor   Drug use: No    Types: Marijuana, Methamphetamines, Oxycodone    Comment: quit in Aug 2012   Sexual activity: Yes    Birth control/protection:  Condom  Other Topics Concern   Not on file  Social History Narrative   Student at [employer] working on an associates degree in the arts. Graduated from the [employer].   Lives alone.   Parents live in [place], [place].   Social Determinants of Health   Financial Resource Strain: Not on file  Food Insecurity: Not on file  Transportation Needs: Not on file  Physical Activity: Not on file  Stress: Not on file  Social Connections: Not on file   No Known Allergies Family History  Problem Relation Age of Onset   Depression Mother    Colon cancer Paternal Grandfather    Hypertension Other    Stroke Other    Allergies Father    Allergies Mother    Asthma Father    Other Paternal Grandfather        brain tumor       Current Outpatient Medications (Analgesics):    meloxicam (MOBIC) 15 MG tablet, Take 1 tablet (15 mg total) by mouth daily.   naproxen (NAPROSYN) 500 MG tablet, Take 1 tablet (500 mg total) by mouth 2 (two) times daily as needed.   Current Outpatient Medications (Other):  busPIRone (BUSPAR) 30 MG tablet, Take 1 tablet (30 mg total) by mouth 2 (two) times daily.   Cholecalciferol (VITAMIN D3) 50 MCG (2000 UT) capsule, Take 1 capsule (2,000 Units total) by mouth daily.   eszopiclone (LUNESTA) 1 MG TABS tablet, Take 1 tablet (1 mg total) by mouth at bedtime as needed for sleep. Take immediately before bedtime   FLUoxetine (PROZAC) 20 MG tablet, Take 1 tablet (20 mg total) by mouth daily.   traZODone (DESYREL) 50 MG tablet, Take 0.5-1 tablets (25-50 mg total) by mouth at bedtime as needed for sleep.   triamcinolone cream (KENALOG) 0.1 %, Apply 1 Application topically 2 (two) times daily as needed.   Zinc Sulfate (ZINC 15 PO), Take 1 tablet by mouth daily.   Reviewed prior external information including notes and imaging from  primary care provider As well as notes that were available from care everywhere and other healthcare systems.  Past medical history,  social, surgical and family history all reviewed in electronic medical record.  No pertanent information unless stated regarding to the chief complaint.   Review of Systems:  No headache, visual changes, nausea, vomiting, diarrhea, constipation, dizziness, abdominal pain, skin rash, fevers, chills, night sweats, weight loss, swollen lymph nodes, body aches, joint swelling, chest pain, shortness of breath, mood changes. POSITIVE muscle aches  Objective  Blood pressure 124/84, pulse 77, height 6\' 1"  (1.854 m), weight 189 lb (85.7 kg), SpO2 98%.   General: No apparent distress alert and oriented x3 mood and affect normal, dressed appropriately.  HEENT: Pupils equal, extraocular movements intact  Respiratory: Patient's speak in full sentences and does not appear short of breath  Cardiovascular: No lower extremity edema, non tender, no erythema  Left foot exam shows patient does have stiffness noted of the first MTP.  Patient does foot seem to be relatively normal.  No significant erythema or redness noted.  Left shoulder does have positive impingement noted.   Limited muscular skeletal ultrasound was performed and interpreted by Antoine Primas, M  Limited ultrasound shows patient's foot does have hypoechoic changes in the first metatarsal consistent with a capsulitis.  Patient's shoulder shows hypoechoic changes noted of the subacromial space.  Does have some hyperechoic changes significant for potential previous tear of the rotator cuff but no significant acute tear noted. Impression: Subacromial bursitis with likely injury rotator cuff previously but seems to be healing.  [id number]; 15 additional minutes spent for Therapeutic exercises as stated in above notes.  This included exercises focusing on stretching, strengthening, with significant focus on eccentric aspects.   Long term goals include an improvement in range of motion, strength, endurance as well as avoiding reinjury. Patient's frequency  would include in 1-2 times a day, 3-5 times a week for a duration of 6-12 weeks. Shoulder Exercises that included:  Basic scapular stabilization to include adduction and depression of scapula Scaption, focusing on proper movement and good control Internal and External rotation utilizing a theraband, with elbow tucked at side entire time Rows with theraband   Proper technique shown and discussed handout in great detail with ATC.  All questions were discussed and answered.      Impression and Recommendations:     The above documentation has been reviewed and is accurate and complete Judi Saa, DO

## 2023-08-10 ENCOUNTER — Encounter: Payer: Self-pay | Admitting: Family Medicine

## 2023-08-10 ENCOUNTER — Other Ambulatory Visit: Payer: Self-pay

## 2023-08-10 ENCOUNTER — Ambulatory Visit (INDEPENDENT_AMBULATORY_CARE_PROVIDER_SITE_OTHER): Payer: 59 | Admitting: Family Medicine

## 2023-08-10 VITALS — BP 124/84 | HR 77 | Ht 73.0 in | Wt 189.0 lb

## 2023-08-10 DIAGNOSIS — M79672 Pain in left foot: Secondary | ICD-10-CM

## 2023-08-10 DIAGNOSIS — M7552 Bursitis of left shoulder: Secondary | ICD-10-CM

## 2023-08-10 DIAGNOSIS — M25512 Pain in left shoulder: Secondary | ICD-10-CM

## 2023-08-10 DIAGNOSIS — F411 Generalized anxiety disorder: Secondary | ICD-10-CM | POA: Diagnosis not present

## 2023-08-10 MED ORDER — MELOXICAM 15 MG PO TABS: 15.0000 mg | ORAL_TABLET | Freq: Every day | ORAL | 0 refills | Status: DC

## 2023-08-10 NOTE — Patient Instructions (Addendum)
Do prescribed exercises at least 3x a week Meloxicam 15mg  prescribed Ice after activity Hoka or Oofos recovery sandals in house See you again in 7-8 weeks

## 2023-08-10 NOTE — Assessment & Plan Note (Signed)
Bursitis of the left shoulder.  Meloxicam prescribed today.  Discussed with patient that worsening pain consider formal physical therapy and injections.  Will do home exercises initially and see how patient responds.

## 2023-08-10 NOTE — Assessment & Plan Note (Signed)
Patient does have a capsulitis noted of the first toe.  Discussed potential different things to do.  Discussed over-the-counter orthotics, recovery sandals, will consider injection if necessary at follow-up.  Patient wants to hold at this time.

## 2023-08-17 DIAGNOSIS — F411 Generalized anxiety disorder: Secondary | ICD-10-CM | POA: Diagnosis not present

## 2023-08-31 DIAGNOSIS — F411 Generalized anxiety disorder: Secondary | ICD-10-CM | POA: Diagnosis not present

## 2023-09-07 DIAGNOSIS — F411 Generalized anxiety disorder: Secondary | ICD-10-CM | POA: Diagnosis not present

## 2023-09-14 DIAGNOSIS — F411 Generalized anxiety disorder: Secondary | ICD-10-CM | POA: Diagnosis not present

## 2023-09-25 ENCOUNTER — Other Ambulatory Visit: Payer: Self-pay

## 2023-09-25 NOTE — Telephone Encounter (Signed)
Copied from CRM [id number]. Topic: Clinical - Medication Refill >> Sep 24, 2023  4:44 PM Fuller Mandril wrote:  Most Recent Primary Care Visit:  Provider: Tresa Garter  Department: [facility]  Visit Type: OFFICE VISIT  Date: 06/17/2023  Medication: FLUoxetine (PROZAC) 20 MG tablet   Has the patient contacted their pharmacy? Yes - pharmacy states they requested 3 days ago  (Agent: If no, request that the patient contact the pharmacy for the refill. If patient does not wish to contact the pharmacy document the reason why and proceed with request.) (Agent: If yes, when and what did the pharmacy advise?)  Is this the correct pharmacy for this prescription? Yes If no, delete pharmacy and type the correct one.  This is the patient's preferred pharmacy:   [id number], [place] - [address] [facility] [place] [address] Phone: [phone] Fax: [phone]   Has the prescription been filled recently? No  Is the patient out of the medication? Yes  Has the patient been seen for an appointment in the last year OR does the patient have an upcoming appointment? Yes  Can we respond through MyChart? No  Agent: Please be advised that Rx refills may take up to 3 business days. We ask that you follow-up with your pharmacy.

## 2023-09-28 DIAGNOSIS — F411 Generalized anxiety disorder: Secondary | ICD-10-CM | POA: Diagnosis not present

## 2023-10-01 ENCOUNTER — Telehealth: Payer: Self-pay

## 2023-10-01 NOTE — Telephone Encounter (Signed)
 Copied from CRM [id number]. Topic: Clinical - Medication Refill >> Sep 29, 2023  4:28 PM Tiffany H wrote: Most Recent Primary Care Visit:  Provider: GARALD KARLYNN GAILS  Department: [facility]  Visit Type: OFFICE VISIT  Date: 06/17/2023  Medication: FLUoxetine  (PROZAC ) 20 MG tablet. Please expedite. Patient has been out of medication for a few days.   Has the patient contacted their pharmacy? Yes (Agent: If no, request that the patient contact the pharmacy for the refill. If patient does not wish to contact the pharmacy document the reason why and proceed with request.) (Agent: If yes, when and what did the pharmacy advise?) Circled patient back to office.   Is this the correct pharmacy for this prescription? Yes If no, delete pharmacy and type the correct one.  This is the patient's preferred pharmacy:   [id number], [place] - [address] [facility]  [address] Phone: [phone] Fax: [phone]   Has the prescription been filled recently? No  Is the patient out of the medication? Yes  Has the patient been seen for an appointment in the last year OR does the patient have an upcoming appointment? Yes  Can we respond through MyChart? Yes  Agent: Please be advised that Rx refills may take up to 3 business days. We ask that you follow-up with your pharmacy.

## 2023-10-01 NOTE — Progress Notes (Deleted)
 [facility] [address] [place] [address] Phone: [phone] Subjective:    I'm seeing this patient by the request  of:  Plotnikov, Karlynn GAILS, MD  CC:   YEP:[id number]  08/10/2023 Bursitis of the left shoulder.  Meloxicam  prescribed today.  Discussed with patient that worsening pain consider formal physical therapy and injections.  Will do home exercises initially and see how patient responds.     Patient does have a capsulitis noted of the first toe.  Discussed potential different things to do.  Discussed over-the-counter orthotics, recovery sandals, will consider injection if necessary at follow-up.  Patient wants to hold at this time.     Update 10/07/2023 Jay Stevenson is a 33 y.o. male coming in with complaint of L foot pain. Patient states     Past Medical History:  Diagnosis Date   Acute urinary retention 04/14/2012   6/13 due to oxycodone ; he had to be cathed    Alcohol  abuse    Ataxia 06/16/2011   Induced by prior drug use, I guess    Depression    H/O: substance abuse (HCC)    IBS (irritable bowel syndrome)    Irritable bowel syndrome 06/27/2013   Mood disorder (HCC)    anger management   Panic attack    Polysubstance abuse (HCC)    Seasonal allergies    Thrombosed hemorrhoids 04/14/2012   Summer 2013; seems to be related to constipation; painful - s/p incision    Past Surgical History:  Procedure Laterality Date   WISDOM TOOTH EXTRACTION     Social History   Socioeconomic History   Marital status: Single    Spouse name: n/a   Number of children: 0   Years of education: Not on file   Highest education level: Not on file  Occupational History   Occupation: unemployed    Comment: previous conservation officer, nature, floor work  Tobacco Use   Smoking status: Former    Current packs/day: 0.00    Types: Cigarettes    Start date: 01/13/2011    Quit date: 01/13/2012    Years since quitting: 11.7   Smokeless tobacco: Never    Tobacco comments:    2-3 ciggs a week  Substance and Sexual Activity   Alcohol  use: Yes    Comment: minor   Drug use: No    Types: Marijuana, Methamphetamines, Oxycodone     Comment: quit in Aug 2012   Sexual activity: Yes    Birth control/protection: Condom  Other Topics Concern   Not on file  Social History Narrative   Student at [employer] working on an associates degree in the arts. Graduated from the [employer].   Lives alone.   Parents live in [place], [place].   Social Drivers of Corporate Investment Banker Strain: Not on file  Food Insecurity: Not on file  Transportation Needs: Not on file  Physical Activity: Not on file  Stress: Not on file  Social Connections: Not on file   No Known Allergies Family History  Problem Relation Age of Onset   Depression Mother    Colon cancer Paternal Grandfather    Hypertension Other    Stroke Other    Allergies Father    Allergies Mother    Asthma Father    Other Paternal Grandfather        brain tumor       Current Outpatient Medications (Analgesics):    meloxicam  (MOBIC ) 15 MG tablet, Take 1  tablet (15 mg total) by mouth daily.   naproxen  (NAPROSYN ) 500 MG tablet, Take 1 tablet (500 mg total) by mouth 2 (two) times daily as needed.   Current Outpatient Medications (Other):    busPIRone  (BUSPAR ) 30 MG tablet, Take 1 tablet (30 mg total) by mouth 2 (two) times daily.   Cholecalciferol (VITAMIN D3) 50 MCG (2000 UT) capsule, Take 1 capsule (2,000 Units total) by mouth daily.   eszopiclone  (LUNESTA ) 1 MG TABS tablet, Take 1 tablet (1 mg total) by mouth at bedtime as needed for sleep. Take immediately before bedtime   FLUoxetine  (PROZAC ) 20 MG tablet, Take 1 tablet (20 mg total) by mouth daily.   traZODone  (DESYREL ) 50 MG tablet, Take 0.5-1 tablets (25-50 mg total) by mouth at bedtime as needed for sleep.   triamcinolone  cream (KENALOG ) 0.1 %, Apply 1 Application topically 2 (two) times daily as needed.   Zinc Sulfate (ZINC  15 PO), Take 1 tablet by mouth daily.   Reviewed prior external information including notes and imaging from  primary care provider As well as notes that were available from care everywhere and other healthcare systems.  Past medical history, social, surgical and family history all reviewed in electronic medical record.  No pertanent information unless stated regarding to the chief complaint.   Review of Systems:  No headache, visual changes, nausea, vomiting, diarrhea, constipation, dizziness, abdominal pain, skin rash, fevers, chills, night sweats, weight loss, swollen lymph nodes, body aches, joint swelling, chest pain, shortness of breath, mood changes. POSITIVE muscle aches  Objective  There were no vitals taken for this visit.   General: No apparent distress alert and oriented x3 mood and affect normal, dressed appropriately.  HEENT: Pupils equal, extraocular movements intact  Respiratory: Patient's speak in full sentences and does not appear short of breath  Cardiovascular: No lower extremity edema, non tender, no erythema      Impression and Recommendations:

## 2023-10-04 MED ORDER — FLUOXETINE HCL 20 MG PO TABS: 20.0000 mg | ORAL_TABLET | Freq: Every day | ORAL | 1 refills | Status: DC

## 2023-10-04 NOTE — Addendum Note (Signed)
 Addended by: Tresa Garter on: 10/04/2023 10:44 PM   Modules accepted: Orders

## 2023-10-04 NOTE — Telephone Encounter (Signed)
 Done. Thank you.

## 2023-10-05 MED ORDER — FLUOXETINE HCL 20 MG PO TABS: 20.0000 mg | ORAL_TABLET | Freq: Every day | ORAL | 1 refills | Status: DC

## 2023-10-07 ENCOUNTER — Ambulatory Visit: Payer: 59 | Admitting: Family Medicine

## 2023-10-12 DIAGNOSIS — F411 Generalized anxiety disorder: Secondary | ICD-10-CM | POA: Diagnosis not present

## 2023-10-26 DIAGNOSIS — F411 Generalized anxiety disorder: Secondary | ICD-10-CM | POA: Diagnosis not present

## 2023-11-09 DIAGNOSIS — F411 Generalized anxiety disorder: Secondary | ICD-10-CM | POA: Diagnosis not present

## 2023-11-10 NOTE — Progress Notes (Deleted)
  [facility] [address] [place] [address] Phone: [phone] Subjective:    I'm seeing this patient by the request  of:  Plotnikov, Georgina Quint, MD  CC: Left shoulder pain follow-up  [id number]  Jay Stevenson is a 33 y.o. male coming in with complaint of shoulder pain.  Patient was found to have more of a capsulitis of the first toe as well as acute pain in the left shoulder.  Patient was to try meloxicam, changed shoes.  Patient states         Reviewed prior external information including notes and imaging from previsou exam, outside providers and external EMR if available.   As well as notes that were available from care everywhere and other healthcare systems.  Past medical history, social, surgical and family history all reviewed in electronic medical record.  No pertanent information unless stated regarding to the chief complaint.   Past Medical History:  Diagnosis Date   Acute urinary retention 04/14/2012   6/13 due to oxycodone; he had to be cathed    Alcohol abuse    Ataxia 06/16/2011   Induced by prior drug use, I guess    Depression    H/O: substance abuse (HCC)    IBS (irritable bowel syndrome)    Irritable bowel syndrome 06/27/2013   Mood disorder (HCC)    anger management   Panic attack    Polysubstance abuse (HCC)    Seasonal allergies    Thrombosed hemorrhoids 04/14/2012   Summer 2013; seems to be related to constipation; painful - s/p incision     No Known Allergies   Review of Systems:  No headache, visual changes, nausea, vomiting, diarrhea, constipation, dizziness, abdominal pain, skin rash, fevers, chills, night sweats, weight loss, swollen lymph nodes, body aches, joint swelling, chest pain, shortness of breath, mood changes. POSITIVE muscle aches  Objective  There were no vitals taken for this visit.   General: No apparent distress alert and oriented x3 mood and affect normal, dressed  appropriately.  HEENT: Pupils equal, extraocular movements intact  Respiratory: Patient's speak in full sentences and does not appear short of breath  Cardiovascular: No lower extremity edema, non tender, no erythema  Gait MSK:   Assessment and Plan:  No problem-specific Assessment & Plan notes found for this encounter.        The above documentation has been reviewed and is accurate and complete Judi Saa, DO         Note: This dictation was prepared with Dragon dictation along with smaller phrase technology. Any transcriptional errors that result from this process are unintentional.

## 2023-11-11 ENCOUNTER — Ambulatory Visit: Payer: 59 | Admitting: Family Medicine

## 2023-11-16 DIAGNOSIS — F411 Generalized anxiety disorder: Secondary | ICD-10-CM | POA: Diagnosis not present

## 2023-11-17 DIAGNOSIS — F411 Generalized anxiety disorder: Secondary | ICD-10-CM | POA: Diagnosis not present

## 2023-11-23 DIAGNOSIS — F411 Generalized anxiety disorder: Secondary | ICD-10-CM | POA: Diagnosis not present

## 2023-11-29 DIAGNOSIS — R1010 Upper abdominal pain, unspecified: Secondary | ICD-10-CM | POA: Diagnosis not present

## 2023-11-29 DIAGNOSIS — D72829 Elevated white blood cell count, unspecified: Secondary | ICD-10-CM | POA: Diagnosis not present

## 2023-11-29 DIAGNOSIS — R112 Nausea with vomiting, unspecified: Secondary | ICD-10-CM | POA: Diagnosis not present

## 2023-12-07 DIAGNOSIS — F411 Generalized anxiety disorder: Secondary | ICD-10-CM | POA: Diagnosis not present

## 2023-12-14 DIAGNOSIS — F411 Generalized anxiety disorder: Secondary | ICD-10-CM | POA: Diagnosis not present

## 2023-12-21 DIAGNOSIS — F411 Generalized anxiety disorder: Secondary | ICD-10-CM | POA: Diagnosis not present

## 2023-12-28 DIAGNOSIS — F411 Generalized anxiety disorder: Secondary | ICD-10-CM | POA: Diagnosis not present

## 2024-01-04 DIAGNOSIS — F411 Generalized anxiety disorder: Secondary | ICD-10-CM | POA: Diagnosis not present

## 2024-01-11 DIAGNOSIS — F411 Generalized anxiety disorder: Secondary | ICD-10-CM | POA: Diagnosis not present

## 2024-01-25 DIAGNOSIS — F411 Generalized anxiety disorder: Secondary | ICD-10-CM | POA: Diagnosis not present

## 2024-02-01 DIAGNOSIS — F411 Generalized anxiety disorder: Secondary | ICD-10-CM | POA: Diagnosis not present

## 2024-02-15 DIAGNOSIS — F411 Generalized anxiety disorder: Secondary | ICD-10-CM | POA: Diagnosis not present

## 2024-03-24 ENCOUNTER — Other Ambulatory Visit: Payer: Self-pay | Admitting: Internal Medicine

## 2024-03-29 ENCOUNTER — Other Ambulatory Visit: Payer: Self-pay | Admitting: Internal Medicine

## 2024-03-31 ENCOUNTER — Other Ambulatory Visit: Payer: Self-pay | Admitting: Internal Medicine

## 2024-03-31 ENCOUNTER — Other Ambulatory Visit (HOSPITAL_COMMUNITY): Payer: Self-pay

## 2024-03-31 ENCOUNTER — Telehealth: Payer: Self-pay

## 2024-03-31 NOTE — Telephone Encounter (Signed)
 Pharmacy Patient Advocate Encounter   Received notification from CoverMyMeds that prior authorization for FLUoxetine  HCl 20MG  tablets  is required/requested.   Insurance verification completed.   The patient is insured through [employer] .   Per test claim: PA required; PA started via CoverMyMeds. KEY [id number] . Waiting for clinical questions to populate.

## 2024-04-04 ENCOUNTER — Other Ambulatory Visit (HOSPITAL_COMMUNITY): Payer: Self-pay

## 2024-04-04 NOTE — Telephone Encounter (Signed)
 PLEASE BE ADVISED Clinical questions have been answered and PA submitted.TO PLAN. PA currently Pending.    KEY [id number]

## 2024-04-05 NOTE — Telephone Encounter (Signed)
 Pharmacy Patient Advocate Encounter  Received notification from [facility] that Prior Authorization for FLUoxetine  HCl 20MG  tablets  has been APPROVED from 04/04/2024 to 04/04/2025   PA #/Case ID/Reference #: [id number]

## 2024-04-06 ENCOUNTER — Other Ambulatory Visit (HOSPITAL_COMMUNITY): Payer: Self-pay

## 2024-06-28 ENCOUNTER — Encounter: Payer: Self-pay | Admitting: Internal Medicine

## 2024-06-28 ENCOUNTER — Ambulatory Visit (INDEPENDENT_AMBULATORY_CARE_PROVIDER_SITE_OTHER): Admitting: Internal Medicine

## 2024-06-28 VITALS — BP 140/85 | HR 66 | Temp 98.4°F | Ht 73.0 in | Wt 172.6 lb

## 2024-06-28 DIAGNOSIS — Z113 Encounter for screening for infections with a predominantly sexual mode of transmission: Secondary | ICD-10-CM

## 2024-06-28 DIAGNOSIS — F5102 Adjustment insomnia: Secondary | ICD-10-CM

## 2024-06-28 DIAGNOSIS — Z0000 Encounter for general adult medical examination without abnormal findings: Secondary | ICD-10-CM

## 2024-06-28 DIAGNOSIS — F39 Unspecified mood [affective] disorder: Secondary | ICD-10-CM | POA: Diagnosis not present

## 2024-06-28 DIAGNOSIS — R030 Elevated blood-pressure reading, without diagnosis of hypertension: Secondary | ICD-10-CM

## 2024-06-28 MED ORDER — FLUOXETINE HCL 20 MG PO TABS
20.0000 mg | ORAL_TABLET | Freq: Every day | ORAL | 1 refills | Status: AC
Start: 2024-06-28 — End: ?

## 2024-06-28 MED ORDER — BUSPIRONE HCL 30 MG PO TABS: 30.0000 mg | ORAL_TABLET | Freq: Two times a day (BID) | ORAL | 1 refills | Status: AC

## 2024-06-28 NOTE — Progress Notes (Signed)
 Subjective:  Patient ID: Jay Stevenson, male    DOB: 12/26/1990  Age: 33 y.o. MRN: [id number]  CC: Annual Exam   HPI Jay Stevenson presents for a well exam.  He is doing well overall. He is at school studying psychology at.  Working part-time.  He is working on his music  Outpatient Medications Prior to Visit  Medication Sig Dispense Refill   Cholecalciferol (VITAMIN D3) 50 MCG (2000 UT) capsule Take 1 capsule (2,000 Units total) by mouth daily. 100 capsule 3   triamcinolone  cream (KENALOG ) 0.1 % Apply 1 Application topically 2 (two) times daily as needed. 160 g 3   busPIRone  (BUSPAR ) 30 MG tablet Take 1 tablet (30 mg total) by mouth 2 (two) times daily. 60 tablet 5   FLUoxetine  (PROZAC ) 20 MG tablet TAKE 1 TABLET BY MOUTH EVERY DAY 90 tablet 0   Zinc Sulfate (ZINC 15 PO) Take 1 tablet by mouth daily.     eszopiclone  (LUNESTA ) 1 MG TABS tablet Take 1 tablet (1 mg total) by mouth at bedtime as needed for sleep. Take immediately before bedtime (Patient not taking: Reported on 06/28/2024) 30 tablet 3   meloxicam  (MOBIC ) 15 MG tablet Take 1 tablet (15 mg total) by mouth daily. (Patient not taking: Reported on 06/28/2024) 30 tablet 0   naproxen  (NAPROSYN ) 500 MG tablet Take 1 tablet (500 mg total) by mouth 2 (two) times daily as needed. (Patient not taking: Reported on 06/28/2024) 60 tablet 2   traZODone  (DESYREL ) 50 MG tablet Take 0.5-1 tablets (25-50 mg total) by mouth at bedtime as needed for sleep. (Patient not taking: Reported on 06/28/2024) 30 tablet 3   No facility-administered medications prior to visit.    ROS: Review of Systems  Constitutional:  Negative for appetite change, fatigue and unexpected weight change.  HENT:  Negative for congestion, nosebleeds, sneezing, sore throat and trouble swallowing.   Eyes:  Negative for itching and visual disturbance.  Respiratory:  Negative for cough.   Cardiovascular:  Negative for chest pain, palpitations and leg swelling.   Gastrointestinal:  Negative for abdominal distention, blood in stool, diarrhea and nausea.  Genitourinary:  Negative for frequency and hematuria.  Musculoskeletal:  Negative for back pain, gait problem, joint swelling and neck pain.  Skin:  Negative for rash.  Neurological:  Negative for dizziness, tremors, speech difficulty and weakness.  Psychiatric/Behavioral:  Negative for agitation, decreased concentration, dysphoric mood and sleep disturbance. The patient is not nervous/anxious.     Objective:  BP (!) 140/85   Pulse 66   Temp 98.4 F (36.9 C) (Oral)   Ht 6' 1 (1.854 m)   Wt 172 lb 9.6 oz (78.3 kg)   SpO2 96%   BMI 22.77 kg/m   BP Readings from Last 3 Encounters:  06/28/24 (!) 140/85  08/10/23 124/84  06/17/23 110/70    Wt Readings from Last 3 Encounters:  06/28/24 172 lb 9.6 oz (78.3 kg)  08/10/23 189 lb (85.7 kg)  06/17/23 178 lb (80.7 kg)    Physical Exam Constitutional:      General: He is not in acute distress.    Appearance: He is well-developed.     Comments: NAD  Eyes:     Conjunctiva/sclera: Conjunctivae normal.     Pupils: Pupils are equal, round, and reactive to light.  Neck:     Thyroid : No thyromegaly.     Vascular: No JVD.  Cardiovascular:     Rate and Rhythm: Normal rate and  regular rhythm.     Heart sounds: Normal heart sounds. No murmur heard.    No friction rub. No gallop.  Pulmonary:     Effort: Pulmonary effort is normal. No respiratory distress.     Breath sounds: Normal breath sounds. No wheezing or rales.  Chest:     Chest wall: No tenderness.  Abdominal:     General: Bowel sounds are normal. There is no distension.     Palpations: Abdomen is soft. There is no mass.     Tenderness: There is no abdominal tenderness. There is no guarding or rebound.  Musculoskeletal:        General: No tenderness. Normal range of motion.     Cervical back: Normal range of motion.  Lymphadenopathy:     Cervical: No cervical adenopathy.  Skin:     General: Skin is warm and dry.     Findings: No rash.  Neurological:     Mental Status: He is alert and oriented to person, place, and time.     Cranial Nerves: No cranial nerve deficit.     Motor: No abnormal muscle tone.     Coordination: Coordination normal.     Gait: Gait normal.     Deep Tendon Reflexes: Reflexes are normal and symmetric.  Psychiatric:        Behavior: Behavior normal.        Thought Content: Thought content normal.        Judgment: Judgment normal.   Testes -self exam  Lab Results  Component Value Date   WBC 5.9 05/07/2022   HGB 15.5 05/07/2022   HCT 45.1 05/07/2022   PLT 289.0 05/07/2022   GLUCOSE 96 05/07/2022   CHOL 161 06/29/2013   TRIG 117 06/29/2013   HDL 45 06/29/2013   LDLCALC 93 06/29/2013   ALT 29 05/07/2022   AST 25 05/07/2022   NA 139 05/07/2022   K 3.6 05/07/2022   CL 102 05/07/2022   CREATININE 0.98 05/07/2022   BUN 13 05/07/2022   CO2 30 05/07/2022   TSH 6.46 (H) 05/07/2022   HGBA1C  04/05/2010    5.0 (NOTE)                                                                       According to the ADA Clinical Practice Recommendations for 2011, when HbA1c is used as a screening test:   >=6.5%   Diagnostic of Diabetes Mellitus           (if abnormal result  is confirmed)  5.7-6.4%   Increased risk of developing Diabetes Mellitus  References:Diagnosis and Classification of Diabetes Mellitus,Diabetes Care,2011,34(Suppl 1):S62-S69 and Standards of Medical Care in         Diabetes - 2011,Diabetes Care,2011,34  (Suppl 1):S11-S61.    DG Ankle Complete Right Result Date: 06/13/2018 CLINICAL DATA:  Right ankle pain and swelling after injury. EXAM: RIGHT ANKLE - COMPLETE 3+ VIEW COMPARISON:  None. FINDINGS: There is no evidence of fracture, dislocation, or joint effusion. There is no evidence of arthropathy or other focal bone abnormality. Lateral soft tissue edema. IMPRESSION: Lateral soft tissue edema without fracture or dislocation.  Electronically Signed   By: Andrea Gasman M.D.   On: 06/13/2018 06:41  Assessment & Plan:   Problem List Items Addressed This Visit     Elevated blood pressure reading   Blood pressure is always normal at home      Insomnia disorder   Doing well on current therapy      Mood disorder   He is doing well overall. He is at school studying psychology at.  Working part-time.  He is working on his music Continue on current management       Well adult exam - Primary    Yordin is doing well overall. He is at school studying psychology at.  Working part-time.  He is working on his music We discussed age appropriate health related issues, including available/recomended screening tests and vaccinations. Labs were ordered to be later reviewed . All questions were answered. We discussed one or more of the following - seat belt use, use of sunscreen/sun exposure exercise, fall risk reduction, second hand smoke exposure, firearm use and storage, seat belt use, a need for adhering to healthy diet and exercise. Labs were ordered.  All questions were answered. Blood pressure is always normal at home.        Relevant Medications   busPIRone  (BUSPAR ) 30 MG tablet   FLUoxetine  (PROZAC ) 20 MG tablet   Other Relevant Orders   TSH   Urinalysis   CBC with Differential/Platelet   Lipid panel   Comprehensive metabolic panel with GFR   HIV Antibody (routine testing w rflx)   Other Visit Diagnoses       Routine screening for STI (sexually transmitted infection)       Relevant Orders   HIV Antibody (routine testing w rflx)         Meds ordered this encounter  Medications   busPIRone  (BUSPAR ) 30 MG tablet    Sig: Take 1 tablet (30 mg total) by mouth 2 (two) times daily.    Dispense:  180 tablet    Refill:  1   FLUoxetine  (PROZAC ) 20 MG tablet    Sig: Take 1 tablet (20 mg total) by mouth daily.    Dispense:  90 tablet    Refill:  1      Follow-up: Return in about 6 months  (around 12/26/2024) for a follow-up visit.  Marolyn Noel, MD

## 2024-06-29 ENCOUNTER — Encounter: Payer: Self-pay | Admitting: Internal Medicine

## 2024-07-03 DIAGNOSIS — Z0000 Encounter for general adult medical examination without abnormal findings: Secondary | ICD-10-CM | POA: Insufficient documentation

## 2024-07-03 NOTE — Assessment & Plan Note (Signed)
Doing well on current therapy. 

## 2024-07-03 NOTE — Assessment & Plan Note (Signed)
 Blood pressure is always normal at home

## 2024-07-03 NOTE — Assessment & Plan Note (Signed)
 He is doing well overall. He is at school studying psychology at.  Working part-time.  He is working on his music Continue on current management

## 2024-07-03 NOTE — Assessment & Plan Note (Signed)
 Jay Stevenson is doing well overall. He is at school studying psychology at.  Working part-time.  He is working on his music We discussed age appropriate health related issues, including available/recomended screening tests and vaccinations. Labs were ordered to be later reviewed . All questions were answered. We discussed one or more of the following - seat belt use, use of sunscreen/sun exposure exercise, fall risk reduction, second hand smoke exposure, firearm use and storage, seat belt use, a need for adhering to healthy diet and exercise. Labs were ordered.  All questions were answered. Blood pressure is always normal at home.

## 2024-12-26 ENCOUNTER — Ambulatory Visit: Admitting: Internal Medicine
